# Patient Record
Sex: Male | Born: 1945 | Race: White | Hispanic: No | Marital: Single | Smoking: Current every day smoker
Health system: Southern US, Community
[De-identification: ages and names within clinical notes are randomized; demographics above are authoritative.]

## PROBLEM LIST (undated history)

## (undated) DIAGNOSIS — F32A Depression, unspecified: Secondary | ICD-10-CM

## (undated) DIAGNOSIS — I10 Essential (primary) hypertension: Secondary | ICD-10-CM

## (undated) DIAGNOSIS — E785 Hyperlipidemia, unspecified: Secondary | ICD-10-CM

## (undated) DIAGNOSIS — F329 Major depressive disorder, single episode, unspecified: Secondary | ICD-10-CM

## (undated) DIAGNOSIS — R413 Other amnesia: Secondary | ICD-10-CM

## (undated) DIAGNOSIS — K219 Gastro-esophageal reflux disease without esophagitis: Secondary | ICD-10-CM

## (undated) DIAGNOSIS — I213 ST elevation (STEMI) myocardial infarction of unspecified site: Secondary | ICD-10-CM

## (undated) HISTORY — DX: Major depressive disorder, single episode, unspecified: F32.9

## (undated) HISTORY — DX: Essential (primary) hypertension: I10

## (undated) HISTORY — DX: Gastro-esophageal reflux disease without esophagitis: K21.9

## (undated) HISTORY — DX: Depression, unspecified: F32.A

## (undated) HISTORY — DX: Hyperlipidemia, unspecified: E78.5

---
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
## 1993-02-04 HISTORY — PX: CHOLECYSTECTOMY: SHX55

## 1996-02-05 HISTORY — PX: HEMORROIDECTOMY: SUR656

## 2001-02-12 ENCOUNTER — Ambulatory Visit (HOSPITAL_COMMUNITY): Admission: RE | Admit: 2001-02-12 | Discharge: 2001-02-12 | Payer: Self-pay | Admitting: Internal Medicine

## 2001-02-12 ENCOUNTER — Encounter (HOSPITAL_BASED_OUTPATIENT_CLINIC_OR_DEPARTMENT_OTHER): Payer: Self-pay | Admitting: Internal Medicine

## 2001-04-02 ENCOUNTER — Encounter: Payer: Self-pay | Admitting: Emergency Medicine

## 2001-04-02 ENCOUNTER — Emergency Department (HOSPITAL_COMMUNITY): Admission: EM | Admit: 2001-04-02 | Discharge: 2001-04-02 | Payer: Self-pay | Admitting: Emergency Medicine

## 2001-04-17 ENCOUNTER — Ambulatory Visit (HOSPITAL_COMMUNITY): Admission: RE | Admit: 2001-04-17 | Discharge: 2001-04-17 | Payer: Self-pay | Admitting: Orthopedic Surgery

## 2001-04-17 ENCOUNTER — Encounter: Payer: Self-pay | Admitting: Orthopedic Surgery

## 2001-09-29 ENCOUNTER — Ambulatory Visit (HOSPITAL_BASED_OUTPATIENT_CLINIC_OR_DEPARTMENT_OTHER): Admission: RE | Admit: 2001-09-29 | Discharge: 2001-09-29 | Payer: Self-pay | Admitting: General Surgery

## 2003-04-21 ENCOUNTER — Emergency Department (HOSPITAL_COMMUNITY): Admission: EM | Admit: 2003-04-21 | Discharge: 2003-04-21 | Payer: Self-pay

## 2008-02-05 HISTORY — PX: SHOULDER ARTHROSCOPY W/ ROTATOR CUFF REPAIR: SHX2400

## 2011-05-06 ENCOUNTER — Encounter: Payer: Self-pay | Admitting: Gastroenterology

## 2011-06-03 ENCOUNTER — Ambulatory Visit (AMBULATORY_SURGERY_CENTER): Payer: Medicare Other | Admitting: *Deleted

## 2011-06-03 VITALS — Ht 69.5 in | Wt 222.0 lb

## 2011-06-03 DIAGNOSIS — Z1211 Encounter for screening for malignant neoplasm of colon: Secondary | ICD-10-CM

## 2011-06-03 MED ORDER — PEG-KCL-NACL-NASULF-NA ASC-C 100 G PO SOLR: ORAL | Status: DC

## 2011-06-04 ENCOUNTER — Encounter: Payer: Self-pay | Admitting: Gastroenterology

## 2011-06-13 ENCOUNTER — Telehealth: Payer: Self-pay | Admitting: *Deleted

## 2011-06-13 ENCOUNTER — Telehealth: Payer: Self-pay | Admitting: Gastroenterology

## 2011-06-13 NOTE — Telephone Encounter (Signed)
Pt states his pharmacist told him, "there are other preps that are cheaper, like the Miralax prep."  Writer explained that Dr. Jarold Motto doesn't use the Miralax prep at all and pt states he can't afford this.  Writer will leave a Moviprep voucher at the front desk with his name on it and he will pick up tomorrow

## 2011-06-17 ENCOUNTER — Ambulatory Visit (AMBULATORY_SURGERY_CENTER): Payer: Medicare Other | Admitting: Gastroenterology

## 2011-06-17 ENCOUNTER — Encounter: Payer: Self-pay | Admitting: Gastroenterology

## 2011-06-17 VITALS — BP 159/76 | HR 78 | Temp 96.6°F | Resp 18 | Ht 69.0 in | Wt 222.0 lb

## 2011-06-17 DIAGNOSIS — D126 Benign neoplasm of colon, unspecified: Secondary | ICD-10-CM

## 2011-06-17 DIAGNOSIS — Z1211 Encounter for screening for malignant neoplasm of colon: Secondary | ICD-10-CM

## 2011-06-17 MED ORDER — SODIUM CHLORIDE 0.9 % IV SOLN: 500.0000 mL | INTRAVENOUS | Status: DC

## 2011-06-17 NOTE — Progress Notes (Signed)
Patient did not experience any of the following events: a burn prior to discharge; a fall within the facility; wrong site/side/patient/procedure/implant event; or a hospital transfer or hospital admission upon discharge from the facility. (G8907) Patient did not have preoperative order for IV antibiotic SSI prophylaxis. (G8918)  

## 2011-06-17 NOTE — Op Note (Signed)
[facility] [address] [place], [place]  [address]  COLONOSCOPY PROCEDURE REPORT  PATIENT:  Austin Mora, Austin Mora  MR#:  [id number] BIRTHDATE:  1945-03-12, 65 yrs. old  GENDER:  male ENDOSCOPIST:  Vania Rea. Jarold Motto, MD, [facility] REF. BY:  Jarome Matin, M.D. PROCEDURE DATE:  06/17/2011 PROCEDURE:  Colonoscopy with snare polypectomy ASA CLASS:  Class II INDICATIONS:  Routine Risk Screening MEDICATIONS:   propofol (Diprivan) 160 mg IV  DESCRIPTION OF PROCEDURE:   After the risks and benefits and of the procedure were explained, informed consent was obtained. Digital rectal exam was performed and revealed no abnormalities. The LB [id number] endoscope was introduced through the anus and advanced to the cecum, which was identified by both the appendix and ileocecal valve.  The quality of the prep was excellent, using MoviPrep.  The instrument was then slowly withdrawn as the colon was fully examined. <<PROCEDUREIMAGES>>  FINDINGS:  A diminutive polyp was found in the mid transverse colon. 3 MM POLYP COLD SNARE EXCISED.SEE PICTURE.  This was otherwise a normal examination of the colon.   Retroflexed views in the rectum revealed no abnormalities.    The scope was then withdrawn from the patient and the procedure completed.  COMPLICATIONS:  None ENDOSCOPIC IMPRESSION: 1) Diminutive polyp in the mid transverse colon 2) Otherwise normal examination r/o adenoma RECOMMENDATIONS: 1) Repeat colonoscopy in 5 years if polyp adenomatous; otherwise 10 years  REPEAT EXAM:  No  ______________________________ Vania Rea. Jarold Motto, MD, [facility]  CC:  n. eSIGNED:   Vania Rea. Kenyatta Gloeckner at 06/17/2011 09:16 AM  Felizardo Hoffmann, [phone]

## 2011-06-17 NOTE — Patient Instructions (Signed)
YOU HAD AN ENDOSCOPIC PROCEDURE TODAY AT THE [facility]: Refer to the procedure report that was given to you for any specific questions about what was found during the examination.  If the procedure report does not answer your questions, please call your gastroenterologist to clarify.  If you requested that your care partner not be given the details of your procedure findings, then the procedure report has been included in a sealed envelope for you to review at your convenience later.  YOU SHOULD EXPECT: Some feelings of bloating in the abdomen. Passage of more gas than usual.  Walking can help get rid of the air that was put into your GI tract during the procedure and reduce the bloating. If you had a lower endoscopy (such as a colonoscopy or flexible sigmoidoscopy) you may notice spotting of blood in your stool or on the toilet paper. If you underwent a bowel prep for your procedure, then you may not have a normal bowel movement for a few days.  DIET: Your first meal following the procedure should be a light meal and then it is ok to progress to your normal diet.  A half-sandwich or bowl of soup is an example of a good first meal.  Heavy or fried foods are harder to digest and may make you feel nauseous or bloated.  Likewise meals heavy in dairy and vegetables can cause extra gas to form and this can also increase the bloating.  Drink plenty of fluids but you should avoid alcoholic beverages for 24 hours.  ACTIVITY: Your care partner should take you home directly after the procedure.  You should plan to take it easy, moving slowly for the rest of the day.  You can resume normal activity the day after the procedure however you should NOT DRIVE or use heavy machinery for 24 hours (because of the sedation medicines used during the test).    SYMPTOMS TO REPORT IMMEDIATELY: A gastroenterologist can be reached at any hour.  During normal business hours, 8:30 AM to 5:00 PM Monday through Friday,  call [phone].  After hours and on weekends, please call the GI answering service at [phone] who will take a message and have the physician on call contact you.   Following lower endoscopy (colonoscopy or flexible sigmoidoscopy):  Excessive amounts of blood in the stool  Significant tenderness or worsening of abdominal pains  Swelling of the abdomen that is new, acute  Fever of 100F or higher  Following upper endoscopy (EGD)  Vomiting of blood or coffee ground material  New chest pain or pain under the shoulder blades  Painful or persistently difficult swallowing  New shortness of breath  Fever of 100F or higher  Black, tarry-looking stools  FOLLOW UP: If any biopsies were taken you will be contacted by phone or by letter within the next 1-3 weeks.  Call your gastroenterologist if you have not heard about the biopsies in 3 weeks.  Our staff will call the home number listed on your records the next business day following your procedure to check on you and address any questions or concerns that you may have at that time regarding the information given to you following your procedure. This is a courtesy call and so if there is no answer at the home number and we have not heard from you through the emergency physician on call, we will assume that you have returned to your regular daily activities without incident.  SIGNATURES/CONFIDENTIALITY: You and/or your care   partner have signed paperwork which will be entered into your electronic medical record.  These signatures attest to the fact that that the information above on your After Visit Summary has been reviewed and is understood.  Full responsibility of the confidentiality of this discharge information lies with you and/or your care-partner.   HANDOUT ON POLYPS 

## 2011-06-18 ENCOUNTER — Telehealth: Payer: Self-pay | Admitting: *Deleted

## 2011-06-18 NOTE — Telephone Encounter (Signed)
previst nurse called to follow up with call.

## 2011-06-18 NOTE — Telephone Encounter (Signed)
  Follow up Call-  Call back number 06/17/2011  Post procedure Call Back phone  # [phone]  Permission to leave phone message Yes     No answer and answering machine did not pick up

## 2011-06-24 ENCOUNTER — Encounter: Payer: Self-pay | Admitting: Gastroenterology

## 2013-09-16 ENCOUNTER — Encounter: Payer: Self-pay | Admitting: Gastroenterology

## 2014-06-27 ENCOUNTER — Encounter: Payer: Self-pay | Admitting: Gastroenterology

## 2014-09-29 ENCOUNTER — Other Ambulatory Visit: Payer: Self-pay | Admitting: Internal Medicine

## 2014-09-29 DIAGNOSIS — Z720 Tobacco use: Secondary | ICD-10-CM

## 2016-05-30 DIAGNOSIS — M1711 Unilateral primary osteoarthritis, right knee: Secondary | ICD-10-CM | POA: Diagnosis not present

## 2016-05-30 DIAGNOSIS — S83241A Other tear of medial meniscus, current injury, right knee, initial encounter: Secondary | ICD-10-CM | POA: Diagnosis not present

## 2016-08-13 DIAGNOSIS — Z1389 Encounter for screening for other disorder: Secondary | ICD-10-CM | POA: Diagnosis not present

## 2016-08-13 DIAGNOSIS — Z720 Tobacco use: Secondary | ICD-10-CM | POA: Diagnosis not present

## 2016-08-13 DIAGNOSIS — R05 Cough: Secondary | ICD-10-CM | POA: Diagnosis not present

## 2016-08-13 DIAGNOSIS — E1129 Type 2 diabetes mellitus with other diabetic kidney complication: Secondary | ICD-10-CM | POA: Diagnosis not present

## 2016-08-13 DIAGNOSIS — R413 Other amnesia: Secondary | ICD-10-CM | POA: Diagnosis not present

## 2016-08-13 DIAGNOSIS — Z6828 Body mass index (BMI) 28.0-28.9, adult: Secondary | ICD-10-CM | POA: Diagnosis not present

## 2016-11-14 DIAGNOSIS — Z720 Tobacco use: Secondary | ICD-10-CM | POA: Diagnosis not present

## 2016-11-14 DIAGNOSIS — I10 Essential (primary) hypertension: Secondary | ICD-10-CM | POA: Diagnosis not present

## 2016-11-14 DIAGNOSIS — K219 Gastro-esophageal reflux disease without esophagitis: Secondary | ICD-10-CM | POA: Diagnosis not present

## 2016-11-14 DIAGNOSIS — Z6828 Body mass index (BMI) 28.0-28.9, adult: Secondary | ICD-10-CM | POA: Diagnosis not present

## 2016-11-14 DIAGNOSIS — E7849 Other hyperlipidemia: Secondary | ICD-10-CM | POA: Diagnosis not present

## 2016-11-14 DIAGNOSIS — N183 Chronic kidney disease, stage 3 (moderate): Secondary | ICD-10-CM | POA: Diagnosis not present

## 2016-11-14 DIAGNOSIS — Z0000 Encounter for general adult medical examination without abnormal findings: Secondary | ICD-10-CM | POA: Diagnosis not present

## 2016-11-14 DIAGNOSIS — Z125 Encounter for screening for malignant neoplasm of prostate: Secondary | ICD-10-CM | POA: Diagnosis not present

## 2016-11-14 DIAGNOSIS — E1129 Type 2 diabetes mellitus with other diabetic kidney complication: Secondary | ICD-10-CM | POA: Diagnosis not present

## 2016-11-14 DIAGNOSIS — R05 Cough: Secondary | ICD-10-CM | POA: Diagnosis not present

## 2016-11-21 DIAGNOSIS — R413 Other amnesia: Secondary | ICD-10-CM | POA: Diagnosis not present

## 2016-11-21 DIAGNOSIS — E7849 Other hyperlipidemia: Secondary | ICD-10-CM | POA: Diagnosis not present

## 2016-11-21 DIAGNOSIS — I10 Essential (primary) hypertension: Secondary | ICD-10-CM | POA: Diagnosis not present

## 2016-11-21 DIAGNOSIS — N183 Chronic kidney disease, stage 3 (moderate): Secondary | ICD-10-CM | POA: Diagnosis not present

## 2016-11-21 DIAGNOSIS — D751 Secondary polycythemia: Secondary | ICD-10-CM | POA: Diagnosis not present

## 2016-11-21 DIAGNOSIS — R05 Cough: Secondary | ICD-10-CM | POA: Diagnosis not present

## 2016-11-21 DIAGNOSIS — Z0000 Encounter for general adult medical examination without abnormal findings: Secondary | ICD-10-CM | POA: Diagnosis not present

## 2016-11-21 DIAGNOSIS — Z23 Encounter for immunization: Secondary | ICD-10-CM | POA: Diagnosis not present

## 2016-11-21 DIAGNOSIS — E1129 Type 2 diabetes mellitus with other diabetic kidney complication: Secondary | ICD-10-CM | POA: Diagnosis not present

## 2016-11-21 DIAGNOSIS — F5104 Psychophysiologic insomnia: Secondary | ICD-10-CM | POA: Diagnosis not present

## 2016-11-25 DIAGNOSIS — Z1212 Encounter for screening for malignant neoplasm of rectum: Secondary | ICD-10-CM | POA: Diagnosis not present

## 2017-05-29 DIAGNOSIS — R05 Cough: Secondary | ICD-10-CM | POA: Diagnosis not present

## 2017-05-29 DIAGNOSIS — R413 Other amnesia: Secondary | ICD-10-CM | POA: Diagnosis not present

## 2017-05-29 DIAGNOSIS — Z1389 Encounter for screening for other disorder: Secondary | ICD-10-CM | POA: Diagnosis not present

## 2017-05-29 DIAGNOSIS — F5104 Psychophysiologic insomnia: Secondary | ICD-10-CM | POA: Diagnosis not present

## 2017-05-29 DIAGNOSIS — Z6828 Body mass index (BMI) 28.0-28.9, adult: Secondary | ICD-10-CM | POA: Diagnosis not present

## 2017-05-29 DIAGNOSIS — E1129 Type 2 diabetes mellitus with other diabetic kidney complication: Secondary | ICD-10-CM | POA: Diagnosis not present

## 2017-05-30 ENCOUNTER — Other Ambulatory Visit: Payer: Self-pay | Admitting: Internal Medicine

## 2017-05-30 DIAGNOSIS — Z720 Tobacco use: Secondary | ICD-10-CM

## 2017-05-30 DIAGNOSIS — Z0000 Encounter for general adult medical examination without abnormal findings: Secondary | ICD-10-CM

## 2017-06-03 DIAGNOSIS — J449 Chronic obstructive pulmonary disease, unspecified: Secondary | ICD-10-CM | POA: Diagnosis not present

## 2017-06-04 DIAGNOSIS — J449 Chronic obstructive pulmonary disease, unspecified: Secondary | ICD-10-CM | POA: Diagnosis not present

## 2017-06-06 ENCOUNTER — Ambulatory Visit
Admission: RE | Admit: 2017-06-06 | Discharge: 2017-06-06 | Disposition: A | Discharge status: Home / Self Care | Payer: Medicare PPO | Source: Ambulatory Visit | Attending: Internal Medicine | Admitting: Internal Medicine

## 2017-06-06 DIAGNOSIS — F17210 Nicotine dependence, cigarettes, uncomplicated: Secondary | ICD-10-CM | POA: Diagnosis not present

## 2017-06-06 DIAGNOSIS — Z0000 Encounter for general adult medical examination without abnormal findings: Secondary | ICD-10-CM

## 2017-06-06 DIAGNOSIS — Z720 Tobacco use: Secondary | ICD-10-CM

## 2017-06-11 DIAGNOSIS — I2510 Atherosclerotic heart disease of native coronary artery without angina pectoris: Secondary | ICD-10-CM | POA: Diagnosis not present

## 2017-06-11 DIAGNOSIS — Z720 Tobacco use: Secondary | ICD-10-CM | POA: Diagnosis not present

## 2017-06-11 DIAGNOSIS — I10 Essential (primary) hypertension: Secondary | ICD-10-CM | POA: Diagnosis not present

## 2017-06-11 DIAGNOSIS — E119 Type 2 diabetes mellitus without complications: Secondary | ICD-10-CM | POA: Diagnosis not present

## 2017-06-16 DIAGNOSIS — I2510 Atherosclerotic heart disease of native coronary artery without angina pectoris: Secondary | ICD-10-CM | POA: Diagnosis not present

## 2017-06-23 DIAGNOSIS — I2510 Atherosclerotic heart disease of native coronary artery without angina pectoris: Secondary | ICD-10-CM | POA: Diagnosis not present

## 2017-06-23 DIAGNOSIS — E119 Type 2 diabetes mellitus without complications: Secondary | ICD-10-CM | POA: Diagnosis not present

## 2017-06-23 DIAGNOSIS — Z720 Tobacco use: Secondary | ICD-10-CM | POA: Diagnosis not present

## 2017-06-23 DIAGNOSIS — I10 Essential (primary) hypertension: Secondary | ICD-10-CM | POA: Diagnosis not present

## 2017-07-15 DIAGNOSIS — I951 Orthostatic hypotension: Secondary | ICD-10-CM | POA: Diagnosis not present

## 2017-07-21 DIAGNOSIS — I2510 Atherosclerotic heart disease of native coronary artery without angina pectoris: Secondary | ICD-10-CM | POA: Diagnosis not present

## 2017-07-21 DIAGNOSIS — I951 Orthostatic hypotension: Secondary | ICD-10-CM | POA: Diagnosis not present

## 2017-07-21 DIAGNOSIS — I10 Essential (primary) hypertension: Secondary | ICD-10-CM | POA: Diagnosis not present

## 2017-07-21 DIAGNOSIS — E119 Type 2 diabetes mellitus without complications: Secondary | ICD-10-CM | POA: Diagnosis not present

## 2017-11-19 ENCOUNTER — Ambulatory Visit: Payer: Medicare PPO | Admitting: Neurology

## 2017-11-19 ENCOUNTER — Encounter: Payer: Self-pay | Admitting: Neurology

## 2017-11-19 ENCOUNTER — Telehealth: Payer: Self-pay | Admitting: Neurology

## 2017-11-19 ENCOUNTER — Other Ambulatory Visit: Payer: Self-pay

## 2017-11-19 VITALS — BP 121/60 | HR 63 | Ht 69.0 in | Wt 191.5 lb

## 2017-11-19 DIAGNOSIS — G479 Sleep disorder, unspecified: Secondary | ICD-10-CM

## 2017-11-19 DIAGNOSIS — R413 Other amnesia: Secondary | ICD-10-CM

## 2017-11-19 NOTE — Progress Notes (Signed)
Reason for visit: Memory disorder  Referring physician: Dr. Smitty Pluck is a 72 y.o. male  History of present illness:  Austin Mora is a 72 year old left-handed white male with a history of a mild memory disturbance dating back about 2 years.  The patient has not given up any activities of daily living because of memory, but he does have some problems with remembering names for people.  The patient is having some short-term memory issues as well.  He still operates a motor vehicle without difficulty, he has not had any problems with directions.  He is able to keep up with his medications and appointments without problems.  His wife does the finances, and she always has.  The patient reports that he has a lot of problems with fatigue that has been chronic in nature but is gradually worsening over time.  The patient wakes up 2 or 3 times at night, he does snore at night.  The patient may fall asleep during the day when he is inactive.  The patient no longer feels like going out and doing things or socializing.  He does not want to travel.  He is on Crestor, but this was just recently started.  The patient has diabetes, he smokes cigarettes.  He denies any numbness or weakness of the extremities, he denies any significant balance issues.  He denies a family history of memory problems.  He has had blood work done recently that included a vitamin B12 level, I do not have the results of the study.  He comes to this office for an evaluation.  Past Medical History:  Diagnosis Date  . Depression   . Diabetes mellitus    type 2  . GERD (gastroesophageal reflux disease)   . Hyperlipidemia   . Hypertension     Past Surgical History:  Procedure Laterality Date  . APPENDECTOMY  1960  . CHOLECYSTECTOMY  1995  . HEMORROIDECTOMY  1998  . SHOULDER ARTHROSCOPY W/ ROTATOR CUFF REPAIR  2010   left    Family History  Problem Relation Age of Onset  . Heart attack Father   . Heart attack  Brother     Social history:  reports that he has been smoking. He has been smoking about 0.50 packs per day. He has never used smokeless tobacco. He reports that he does not drink alcohol or use drugs.  Medications:  Prior to Admission medications   Medication Sig Start Date End Date Taking? Authorizing Provider  metFORMIN (GLUCOPHAGE-XR) 500 MG 24 hr tablet Take 1 tablet by mouth daily. 04/24/11  Yes [provider]  metoprolol tartrate (LOPRESSOR) 25 MG tablet Take 1 tablet by mouth 2 (two) times daily. 04/24/11  Yes [provider]  omeprazole (PRILOSEC) 20 MG capsule Take 1 capsule by mouth daily. 04/30/11  Yes [provider]  rosuvastatin (CRESTOR) 20 MG tablet Take 20 mg by mouth daily. Every evening after dinner   Yes [provider]  Tamsulosin HCl (FLOMAX) 0.4 MG CAPS Take 1 capsule by mouth at bedtime. 05/07/11  Yes [provider]      Allergies  Allergen Reactions  . Sulfa Antibiotics Hives    ROS:  Out of a complete 14 system review of symptoms, the patient complains only of the following symptoms, and all other reviewed systems are negative.  Memory loss Diarrhea Decreased energy  Blood pressure 121/60, pulse 63, height 5\' 9"  (1.753 m), weight 191 lb 8 oz (86.9 kg), SpO2  98 %.  Physical Exam  General: The patient is alert and cooperative at the time of the examination.  Eyes: Pupils are equal, round, and reactive to light. Discs are flat bilaterally.  Neck: The neck is supple, no carotid bruits are noted.  Respiratory: The respiratory examination is clear.  Cardiovascular: The cardiovascular examination reveals a regular rate and rhythm, no obvious murmurs or rubs are noted.  Skin: Extremities are without significant edema.  Neurologic Exam  Mental status: The patient is alert and oriented x 3 at the time of the examination. The patient has apparent normal recent and remote memory, with an apparently normal  attention span and concentration ability.  Mini-Mental status examination done today shows a total score of 28/30.  Cranial nerves: Facial symmetry is present. There is good sensation of the face to pinprick and soft touch bilaterally. The strength of the facial muscles and the muscles to head turning and shoulder shrug are normal bilaterally. Speech is well enunciated, no aphasia or dysarthria is noted. Extraocular movements are full. Visual fields are full. The tongue is midline, and the patient has symmetric elevation of the soft palate. No obvious hearing deficits are noted.  Motor: The motor testing reveals 5 over 5 strength of all 4 extremities. Good symmetric motor tone is noted throughout.  Sensory: Sensory testing is intact to pinprick, soft touch, vibration sensation, and position sense on all 4 extremities. No evidence of extinction is noted.  Coordination: Cerebellar testing reveals good finger-nose-finger and heel-to-shin bilaterally.  Gait and station: Gait is normal. Tandem gait is normal. Romberg is negative. No drift is seen.  Reflexes: Deep tendon reflexes are symmetric and normal bilaterally. Toes are downgoing bilaterally.   Assessment/Plan:  1.  Mild cognitive impairment  2.  Chronic fatigue, daytime drowsiness  The patient does have risk factors for cerebrovascular disease, he is on low-dose aspirin.  The patient will be sent for MRI of the brain.  Given the fatigue issues and frequent nighttime awakenings, history of snoring, the patient will be sent for sleep evaluation.  The patient will follow-up through this office in about 6 months.  Austin Alexanders MD 11/19/2017 8:49 AM  [facility] [address] [place], [place] [address]  Phone [phone] Fax [phone]

## 2017-11-19 NOTE — Telephone Encounter (Signed)
Mcarthur Rossetti Josem Kaufmann: [id number] (exp. 11/19/17 to 12/19/17) order sent to GI. They will reach out to the pt to schedule.

## 2017-11-19 NOTE — Telephone Encounter (Signed)
Patient is aware of this and if he hasn't heard anything in the next 2-3 business days to give them a call at [phone].

## 2017-12-02 DIAGNOSIS — N183 Chronic kidney disease, stage 3 (moderate): Secondary | ICD-10-CM | POA: Diagnosis not present

## 2017-12-02 DIAGNOSIS — Z125 Encounter for screening for malignant neoplasm of prostate: Secondary | ICD-10-CM | POA: Diagnosis not present

## 2017-12-02 DIAGNOSIS — E7849 Other hyperlipidemia: Secondary | ICD-10-CM | POA: Diagnosis not present

## 2017-12-02 DIAGNOSIS — E1129 Type 2 diabetes mellitus with other diabetic kidney complication: Secondary | ICD-10-CM | POA: Diagnosis not present

## 2017-12-02 DIAGNOSIS — R82998 Other abnormal findings in urine: Secondary | ICD-10-CM | POA: Diagnosis not present

## 2017-12-02 DIAGNOSIS — Z23 Encounter for immunization: Secondary | ICD-10-CM | POA: Diagnosis not present

## 2017-12-09 DIAGNOSIS — E1129 Type 2 diabetes mellitus with other diabetic kidney complication: Secondary | ICD-10-CM | POA: Diagnosis not present

## 2017-12-09 DIAGNOSIS — K219 Gastro-esophageal reflux disease without esophagitis: Secondary | ICD-10-CM | POA: Diagnosis not present

## 2017-12-09 DIAGNOSIS — N183 Chronic kidney disease, stage 3 (moderate): Secondary | ICD-10-CM | POA: Diagnosis not present

## 2017-12-09 DIAGNOSIS — I2510 Atherosclerotic heart disease of native coronary artery without angina pectoris: Secondary | ICD-10-CM | POA: Diagnosis not present

## 2017-12-09 DIAGNOSIS — M5416 Radiculopathy, lumbar region: Secondary | ICD-10-CM | POA: Diagnosis not present

## 2017-12-09 DIAGNOSIS — Z0000 Encounter for general adult medical examination without abnormal findings: Secondary | ICD-10-CM | POA: Diagnosis not present

## 2017-12-09 DIAGNOSIS — F5104 Psychophysiologic insomnia: Secondary | ICD-10-CM | POA: Diagnosis not present

## 2017-12-09 DIAGNOSIS — D751 Secondary polycythemia: Secondary | ICD-10-CM | POA: Diagnosis not present

## 2017-12-09 DIAGNOSIS — R413 Other amnesia: Secondary | ICD-10-CM | POA: Diagnosis not present

## 2017-12-19 DIAGNOSIS — Z1212 Encounter for screening for malignant neoplasm of rectum: Secondary | ICD-10-CM | POA: Diagnosis not present

## 2018-01-19 ENCOUNTER — Ambulatory Visit
Admission: RE | Admit: 2018-01-19 | Discharge: 2018-01-19 | Disposition: A | Discharge status: Home / Self Care | Payer: Medicare PPO | Source: Ambulatory Visit | Attending: Neurology | Admitting: Neurology

## 2018-01-19 DIAGNOSIS — R413 Other amnesia: Secondary | ICD-10-CM

## 2018-01-20 ENCOUNTER — Telehealth: Payer: Self-pay | Admitting: Neurology

## 2018-01-20 NOTE — Telephone Encounter (Signed)
  I called the patient.  MRI of the brain shows mild to moderate changes of small vessel disease, the patient does have diabetes and hypertension, he is on low-dose aspirin.  Blood pressures in the office on last evaluation were adequate.  We will follow the memory issues over time.  MRI brain 01/19/18:  IMPRESSION: This MRI of the brain without contrast shows the following: 1.    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 2.    Brain volume is normal for age.

## 2018-01-20 NOTE — Telephone Encounter (Signed)
I contacted the patient's daughter (Amy, listed on DPR) and reviewed MRI results. She voiced understanding and requested that I call the patient and review and schedule f/u. I contacted the patient and reviewed MRI results. Patient verbalized understanding. F/u with Dr. Jannifer Franklin (patient did not want see NPs.) scheduled for 06/18/2017 at 12 pm. MB RN.

## 2018-01-20 NOTE — Telephone Encounter (Signed)
Pt daughter(on DPR-Amy O'Dell) is asking for a call with the results so she too can explain to pt if he is not clear on what RN states to him.  Daughter asking for a call after 3pm

## 2018-01-20 NOTE — Telephone Encounter (Signed)
Pt has called in response to message he received from Dr Jannifer Franklin.  Pt is asking for a call from RN to discuss the content of the message

## 2018-02-09 DIAGNOSIS — R311 Benign essential microscopic hematuria: Secondary | ICD-10-CM | POA: Diagnosis not present

## 2018-02-10 DIAGNOSIS — R413 Other amnesia: Secondary | ICD-10-CM | POA: Diagnosis not present

## 2018-02-10 DIAGNOSIS — F5104 Psychophysiologic insomnia: Secondary | ICD-10-CM | POA: Diagnosis not present

## 2018-02-10 DIAGNOSIS — Z6827 Body mass index (BMI) 27.0-27.9, adult: Secondary | ICD-10-CM | POA: Diagnosis not present

## 2018-02-10 DIAGNOSIS — F3289 Other specified depressive episodes: Secondary | ICD-10-CM | POA: Diagnosis not present

## 2018-03-09 ENCOUNTER — Telehealth: Payer: Self-pay | Admitting: Neurology

## 2018-03-09 NOTE — Telephone Encounter (Signed)
I contacted the pt and left a vm requesting a call back to move up appt with Dr. Jannifer Franklin to discuss memory changes if agreeable.

## 2018-03-09 NOTE — Telephone Encounter (Signed)
Pts daughter Amy (on [place]) called stating the pts forgetfulness is getting worse. Daughter would like to know if he could be reevaluated again and seen sooner than his May appt. Please advise.

## 2018-03-09 NOTE — Telephone Encounter (Signed)
I contacted Amy and left a vm requesting a call back to discuss a earlier appt.

## 2018-04-14 DIAGNOSIS — F3289 Other specified depressive episodes: Secondary | ICD-10-CM | POA: Diagnosis not present

## 2018-04-14 DIAGNOSIS — Z6832 Body mass index (BMI) 32.0-32.9, adult: Secondary | ICD-10-CM | POA: Diagnosis not present

## 2018-04-14 DIAGNOSIS — R413 Other amnesia: Secondary | ICD-10-CM | POA: Diagnosis not present

## 2018-04-14 DIAGNOSIS — F5104 Psychophysiologic insomnia: Secondary | ICD-10-CM | POA: Diagnosis not present

## 2018-04-14 DIAGNOSIS — I2510 Atherosclerotic heart disease of native coronary artery without angina pectoris: Secondary | ICD-10-CM | POA: Diagnosis not present

## 2018-04-14 DIAGNOSIS — E1129 Type 2 diabetes mellitus with other diabetic kidney complication: Secondary | ICD-10-CM | POA: Diagnosis not present

## 2018-06-09 DIAGNOSIS — E1129 Type 2 diabetes mellitus with other diabetic kidney complication: Secondary | ICD-10-CM | POA: Diagnosis not present

## 2018-06-09 DIAGNOSIS — F329 Major depressive disorder, single episode, unspecified: Secondary | ICD-10-CM | POA: Diagnosis not present

## 2018-06-09 DIAGNOSIS — F5104 Psychophysiologic insomnia: Secondary | ICD-10-CM | POA: Diagnosis not present

## 2018-06-09 DIAGNOSIS — I2510 Atherosclerotic heart disease of native coronary artery without angina pectoris: Secondary | ICD-10-CM | POA: Diagnosis not present

## 2018-06-15 ENCOUNTER — Telehealth: Payer: Self-pay | Admitting: Neurology

## 2018-06-15 NOTE — Telephone Encounter (Signed)
Amy Odell(daughter on DPR) has called, she is asking for a call from RN as well re: the appointment which is currently scheduled for 05-15.  Daughter's concern is that pt's wife will not be clearly understood when she tries explaining what is going on with pt.

## 2018-06-15 NOTE — Telephone Encounter (Signed)
Pt can not do a Virtual Visit Pt would like to be contacted back to explore a different option. Please advise.

## 2018-06-15 NOTE — Telephone Encounter (Signed)
I contacted the pt's daughter (Amy ok per dpr) she states pt is in need of a face to face visit to discuss behavioral changes.  I advised we could see the pt on 06/17/18 at 10:30 am. Daughter was agreeable to appt and will call her mom to be on speaker phone during the visit since she lives in [place].  I attempted to reach the pt's wife. I left her a vm to return my call. When wife call's back please advise we will see the pt on 06/17/18 at 10:30 am check in time of 10 am and ask prescreening questions.

## 2018-06-17 ENCOUNTER — Ambulatory Visit: Payer: Self-pay | Admitting: Neurology

## 2018-06-19 ENCOUNTER — Ambulatory Visit: Payer: Self-pay | Admitting: Neurology

## 2018-06-23 ENCOUNTER — Telehealth: Payer: Self-pay | Admitting: Neurology

## 2018-06-23 NOTE — Telephone Encounter (Signed)
Patient called and stated that her symptoms are getting worse and she would like to speak with the nurse regarding this. She feels she may need to be seen soon. Please call and advise.

## 2018-06-23 NOTE — Telephone Encounter (Signed)
I returned the pt's wife [place] Mora, ok per [facility] ) call. She was requesting pt's appt from 06/17/18 be rescheduled due to memory changes. I advised we could see the pt on 07/01/18 at 11 am check in time of 10:30 and wife was agreeable.   Pt past the prescreening for covid 19.

## 2018-07-01 ENCOUNTER — Other Ambulatory Visit: Payer: Self-pay

## 2018-07-01 ENCOUNTER — Ambulatory Visit: Payer: Medicare PPO | Admitting: Neurology

## 2018-07-01 ENCOUNTER — Encounter: Payer: Self-pay | Admitting: Neurology

## 2018-07-01 VITALS — BP 127/68 | HR 68 | Temp 97.5°F | Ht 69.0 in | Wt 186.0 lb

## 2018-07-01 DIAGNOSIS — G479 Sleep disorder, unspecified: Secondary | ICD-10-CM

## 2018-07-01 DIAGNOSIS — R413 Other amnesia: Secondary | ICD-10-CM | POA: Diagnosis not present

## 2018-07-01 HISTORY — DX: Other amnesia: R41.3

## 2018-07-01 MED ORDER — DONEPEZIL HCL 5 MG PO TABS: 5.0000 mg | ORAL_TABLET | Freq: Every day | ORAL | 1 refills | Status: DC

## 2018-07-01 NOTE — Patient Instructions (Signed)
We will start aricept for the memory.  Begin Aricept (donepezil) at 5 mg at night for one month. If this medication is well-tolerated, please call our office and we will call in a prescription for the 10 mg tablets. Look out for side effects that may include nausea, diarrhea, weight loss, or stomach cramps. This medication will also cause a runny nose, therefore there is no need for allergy medications for this purpose.  

## 2018-07-01 NOTE — Progress Notes (Signed)
Reason for visit: Memory disturbance  Austin Mora is an 73 y.o. male  History of present illness:  Austin Mora is a 73 year old left-handed white male with a history of a decline in memory over the last 2-1/2 or 3 years.  The patient has had trouble with remembering names for people, she has short-term memory issues.  He now requires some assistance with managing medications and appointments, his wife has always done the finances.  The patient still operates a motor vehicle without difficulty.  He has had some decline in his functional levels, he will forget how to operate the remote to the TV, he will go into the room and cannot remember what he needed.  The patient needs very little during the day, potentially a bowl of cereal or a bowl of soup.  He sleeps for 12 to 14 hours a day, he is sleeping well at night, he talks in his sleep as well, he may have vivid dreams.  He stopped going to church 7 to 8 months ago.  The patient is on Abilify and Lexapro for depression.  He has had some alteration in his balance, he has had occasional falls, he may bump his head on occasion.  The patient did undergo MRI of the brain which showed a moderate level small vessel disease, he does take low-dose aspirin.  He has a history of hypertension and diabetes, he denies any history of hypoglycemia.  He returns for an evaluation.  He was set up for a sleep evaluation on his last visit but this apparently never happened.  Past Medical History:  Diagnosis Date  . Depression   . Diabetes mellitus    type 2  . GERD (gastroesophageal reflux disease)   . Hyperlipidemia   . Hypertension     Past Surgical History:  Procedure Laterality Date  . APPENDECTOMY  1960  . CHOLECYSTECTOMY  1995  . HEMORROIDECTOMY  1998  . SHOULDER ARTHROSCOPY W/ ROTATOR CUFF REPAIR  2010   left    Family History  Problem Relation Age of Onset  . Heart attack Father   . Heart attack Brother     Social history:  reports that he  has been smoking. He has been smoking about 0.50 packs per day. He has never used smokeless tobacco. He reports that he does not drink alcohol or use drugs.    Allergies  Allergen Reactions  . Sulfa Antibiotics Hives    Medications:  Prior to Admission medications   Medication Sig Start Date End Date Taking? Authorizing Provider  ARIPiprazole (ABILIFY) 5 MG tablet  05/05/18  Yes [provider]  aspirin 81 MG tablet Take 81 mg by mouth daily.   Yes [provider]  escitalopram (LEXAPRO) 10 MG tablet Take 10 mg by mouth daily. 05/08/18  Yes [provider]  omeprazole (PRILOSEC) 20 MG capsule Take 1 capsule by mouth daily. 04/30/11  Yes [provider]  rosuvastatin (CRESTOR) 20 MG tablet Take 20 mg by mouth daily. Every evening after dinner   Yes [provider]  metFORMIN (GLUCOPHAGE-XR) 500 MG 24 hr tablet Take 1 tablet by mouth daily. 04/24/11   [provider]  metoprolol tartrate (LOPRESSOR) 25 MG tablet Take 1 tablet by mouth 2 (two) times daily. 04/24/11   [provider]  Tamsulosin HCl (FLOMAX) 0.4 MG CAPS Take 1 capsule by mouth at bedtime. 05/07/11   [provider]    ROS:  Out of a complete 14 system  review of symptoms, the patient complains only of the following symptoms, and all other reviewed systems are negative.  Memory problems Fatigue, excessive daytime drowsiness Depression  Blood pressure 127/68, pulse 68, temperature (!) 97.5 F (36.4 C), temperature source Oral, height 5\' 9"  (1.753 m), weight 186 lb (84.4 kg).  Physical Exam  General: The patient is alert and cooperative at the time of the examination.  Skin: No significant peripheral edema is noted.   Neurologic Exam  Mental status: The patient is alert and oriented x 3 at the time of the examination. The Mini-Mental status examination done today shows a total score of 25/30.   Cranial nerves: Facial symmetry is present. Speech is  normal, no aphasia or dysarthria is noted. Extraocular movements are full. Visual fields are full.  The patient has some masking the face, flattening of affect.  Motor: The patient has good strength in all 4 extremities.  Sensory examination: Soft touch sensation is symmetric on the face, arms, and legs.  Coordination: The patient has good finger-nose-finger and heel-to-shin bilaterally.  Gait and station: The patient has a normal gait. Tandem gait is normal. Romberg is negative. No drift is seen.  The patient has good bilateral arm swing with walking.  Reflexes: Deep tendon reflexes are symmetric.   Assessment/Plan:  1.  Memory disturbance  2.  Excessive daytime drowsiness  The patient will be once again set up for sleep evaluation.  The patient will be placed on Aricept taking 5 mg at night, he will call after 1 month if he tolerates this and will go to the 10 mg dosing.  He will follow-up here in 6 months.  Jill Alexanders MD 07/01/2018 11:24 AM  [facility] [address] [place], [place] [address]  Phone [phone] Fax [phone]

## 2018-07-06 ENCOUNTER — Telehealth: Payer: Self-pay | Admitting: Neurology

## 2018-07-06 NOTE — Telephone Encounter (Signed)
Called the patient.  The patient is having vivid dreams on the Aricept, he will start taking the medication in the morning, if the vivid dreams continue we will have to stop the medication and use another drug.

## 2018-07-06 NOTE — Telephone Encounter (Signed)
Pt called stating that the donepezil (ARICEPT) 5 MG tablet is giving him bad dreams and its not working. He would like to know if he can try something else in it's place. Please advise.

## 2018-07-16 ENCOUNTER — Other Ambulatory Visit: Payer: Self-pay

## 2018-07-16 ENCOUNTER — Ambulatory Visit (INDEPENDENT_AMBULATORY_CARE_PROVIDER_SITE_OTHER): Payer: Medicare PPO | Admitting: Neurology

## 2018-07-16 ENCOUNTER — Encounter: Payer: Self-pay | Admitting: Neurology

## 2018-07-16 VITALS — BP 116/71 | HR 61 | Temp 97.5°F | Ht 69.0 in | Wt 171.0 lb

## 2018-07-16 DIAGNOSIS — R413 Other amnesia: Secondary | ICD-10-CM

## 2018-07-16 DIAGNOSIS — G4752 REM sleep behavior disorder: Secondary | ICD-10-CM | POA: Diagnosis not present

## 2018-07-16 DIAGNOSIS — G20 Parkinson's disease: Secondary | ICD-10-CM

## 2018-07-16 NOTE — Progress Notes (Signed)
SLEEP MEDICINE CLINIC    Provider:  Larey Seat, MD  Primary Care Physician:  Leanna Battles, [place] [address]     Referring Provider:  Dr Jannifer Franklin, MD          Chief Complaint according to patient   Patient presents with:     New Patient (Initial Visit)           HISTORY OF PRESENT ILLNESS:  Austin Mora is a 73 y.o. year old White or Caucasian male patient seen here as a referral on 07/16/2018 from  Dr Jannifer Franklin oin a face to face visit.  Chief concern according to patient : " I am fatigued but when I go to bed I often can't sleep"     I have the pleasure of seeing Austin Mora today, a left -handed White or Caucasian male with a possible sleep disorder.  She has a  has a past medical history of Depression, Diabetes mellitus, GERD (gastroesophageal reflux disease), Hyperlipidemia, Hypertension, and Memory disorder (07/01/2018)..  The patient lost 50 pounds, years ago and stopped walking after he reached his goal 2005. .        Family medical Rachelle Hora history: No other family member on CPAP with OSA, insomnia, sleep walkers.    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 He is here with his wife and  lives in a household with 2 persons. Family status is married with adult daughter- 2 grandchildren.  The patient currently works part time before corona virus crises.  No pets are  present. Tobacco use- current smoker, 10- 15 cig a day. ETOH use : none . Caffeine intake in form of Coffee( 1 in AM) Soda( during the day-  every day, Tea (soemtimes when eating out ) , no energy drinks. Regular exercise : none  , used to walk.   Hobbies: none     Sleep habits are as follows:  The patient's dinner time is between 6 PM.  The patient goes to bed at midnight PM and struggles to sleep-continues to sleep for 2 hours, wakes again - for 2-3  bathroom breaks, the first time at 2 AM.  He has  trouble to go back to sleep. He has slept often 1-3 hours  already in the recliner in front of the TV, feels that there is better sleep in that position.   The preferred sleep position in bed is supine or side, with the support of his 2 my pillows.  Dreams are reportedly frequent and very vivid. When he wakes up he is often disoriented as to being real or in a dream.  He acts out dreams, defending himself, kicking, yelling, boxing.   The couple quit sleeping in the same room 6 month prior to this appointment.  11-11.45 AM  is the usual rise time.  The patient wakes up spontaneously.  He reports not feeling refreshed or restored in AM, with symptoms such as dry mouth but not  morning headaches Naps are taken frequently, not planned- but he will fall asleep whenever he rests.  lasting from 15 - 50 minutes and are more refreshing than nocturnal sleep.     Review of Systems: Out of a complete 14 system review, the patient complains of only the following symptoms, and all other reviewed systems are negative.: Fatigue, sleepiness , snoring, fragmented sleep, Insomnia - hearing loss, masked face, hoarse voice.  How likely are you to doze in the following situations: 0 = not likely, 1 = slight chance, 2 = moderate chance, 3 = high chance   Sitting and Reading? 2 Watching Television? 2 Sitting inactive in a public place (theater or meeting)? As a passenger in a car for an hour without a break? Lying down in the afternoon when circumstances permit?2 Sitting and talking to someone? Sitting quietly after lunch without alcohol? 2 In a car, while stopped for a few minutes in traffic?   Total = 8/ 24 points   FSS endorsed at 40 / 63 points.   Social History   Socioeconomic History   Marital status: Single    Spouse name: Not on file   Number of children: Not on file   Years of education: 12   Highest education level: Not on file  Occupational History   Occupation: Part time -  Nature conservation officer [employer]  Social Needs   Financial resource strain: Not on file   Food insecurity    Worry: Not on file    Inability: Not on file   Transportation needs    Medical: Not on file    Non-medical: Not on file  Tobacco Use   Smoking status: Current Every Day Smoker    Packs/day: 0.50   Smokeless tobacco: Never Used  Substance and Sexual Activity   Alcohol use: No   Drug use: No   Sexual activity: Not on file  Lifestyle   Physical activity    Days per week: Not on file    Minutes per session: Not on file   Stress: Not on file  Relationships   Social connections    Talks on phone: Not on file    Gets together: Not on file    Attends religious service: Not on file    Active member of club or organization: Not on file    Attends meetings of clubs or organizations: Not on file    Relationship status: Not on file  Other Topics Concern   Not on file  Social History Narrative   Lives with wife   Caffeine use: 2 cups per day (coffee)   Left handed     Family History  Problem Relation Age of Onset   Heart attack Father    Heart attack Brother     Past Medical History:  Diagnosis Date   Depression    Diabetes mellitus    type 2   GERD (gastroesophageal reflux disease)    Hyperlipidemia    Hypertension    Memory disorder 07/01/2018    Past Surgical History:  Procedure Laterality Date   [place] ARTHROSCOPY W/ ROTATOR CUFF REPAIR  2010   left     Current Outpatient Medications on File Prior to Visit  Medication Sig Dispense Refill   ARIPiprazole (ABILIFY) 5 MG tablet      aspirin 81 MG tablet Take 81 mg by mouth daily.     donepezil (ARICEPT) 5 MG tablet Take 1 tablet (5 mg total) by mouth at bedtime. 30 tablet 1   escitalopram (LEXAPRO) 10 MG tablet Take 10 mg by mouth daily.     omeprazole (PRILOSEC) 40 MG capsule Take 1 capsule by mouth daily.       rosuvastatin (CRESTOR) 20 MG tablet Take 20 mg by mouth daily. Every evening after dinner     No current facility-administered medications on file prior to  visit.     Allergies  Allergen Reactions   Sulfa Antibiotics Hives    Physical exam:  Today's Vitals   07/16/18 1255  BP: 116/71  Pulse: 61  Temp: (!) 97.5 F (36.4 C)  Weight: 171 lb (77.6 kg)  Height: 5\' 9"  (1.753 m)   Body mass index is 25.25 kg/m.   Wt Readings from Last 3 Encounters:  07/16/18 171 lb (77.6 kg)  07/01/18 186 lb (84.4 kg)  11/19/17 191 lb 8 oz (86.9 kg)     Ht Readings from Last 3 Encounters:  07/16/18 5\' 9"  (1.753 m)  07/01/18 5\' 9"  (1.753 m)  11/19/17 5\' 9"  (1.753 m)      General: The patient is awake, alert and appears not in acute distress. The patient is well groomed. Head: Normocephalic, atraumatic. Neck is supple. Mallampati: 4 - no tongue tremor.   ,  neck circumference:17" inches . Nasal airflow  patent.  Retrognathia is not seen  Dental status:  Cardiovascular:  Regular rate and cardiac rhythm by pulse,  without distended neck veins. Respiratory: Lungs are clear to auscultation.  Skin:  Without evidence of ankle edema, or rash. Trunk: The patient's posture is stooped.   Neurologic exam : The patient is awake and alert,  oriented to place and time.   Memory subjective described as intact.  Attention span & concentration ability appears normal.  Speech is fluent, without  dysarthria, dysphonia or aphasia.  Mood and affect are appropriate.   Cranial nerves: no loss of smell or taste reported  Pupils are equal and briskly reactive to light.  Funduscopic exam :  Deferred.  Extraocular movements in vertical and horizontal planes were intact and without nystagmus.  No Diplopia. Visual fields by finger perimetry are intact. Hearing impaired.  Facial sensation intact to fine touch.  Facial motor strength is  Reduced, Mimik is reduced.  Symmetric and tongue and uvula move midline.    Neck ROM :  Decreased ability, stiffness  shoulder shrug was asymmetrical., left shoulder drooped- biceps on the left is atrophied.    Motor exam:  Symmetric bulk, tone and ROM.   There is a surprisingly low tone in both arms.   without cog wheeling, symmetric grip strength .   Coordination: Rapid alternating movements in the fingers/hands were of normal speed.  The Finger-to-nose maneuver was intact with evidence of ataxia, dysmetria or tremor.  the patient turned with  steps.  Toe and heel walk were deferred.  Deep tendon reflexes: deferred.        After spending a total time of  40  minutes face to face and additional time for physical and neurologic examination, review of laboratory studies,  personal review of imaging studies, reports and results of other testing and review of referral information / records as far as provided in visit, I have established the following assessments:  1) REM BD-   2) snoring   3) fragmented circadian rhythm    My Plan is to proceed with: REM BD PSG montage -   I would like to thank Leanna Battles, MD and Dr Jannifer Franklin, MD   for allowing me to meet with and to take care of this pleasant patient.   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 He reports memory problems , a symptom that can be attributed to parkinson's disease and associated  Dementia.  .   I plan to follow up either personally  or through our NP within 3 month.     Electronically signed by: Larey Seat, MD 07/16/2018 1:09 PM  [facility] Board certified by The [employer] of Sleep Medicine and Diplomate of the [employer] of Sleep Medicine. Board certified In Neurology through the [place], Fellow of the [employer] of Neurology. Medical Director of [facility].

## 2018-07-21 DIAGNOSIS — I10 Essential (primary) hypertension: Secondary | ICD-10-CM | POA: Diagnosis not present

## 2018-07-21 DIAGNOSIS — F5104 Psychophysiologic insomnia: Secondary | ICD-10-CM | POA: Diagnosis not present

## 2018-07-21 DIAGNOSIS — R413 Other amnesia: Secondary | ICD-10-CM | POA: Diagnosis not present

## 2018-07-21 DIAGNOSIS — F329 Major depressive disorder, single episode, unspecified: Secondary | ICD-10-CM | POA: Diagnosis not present

## 2018-07-23 ENCOUNTER — Telehealth: Payer: Self-pay | Admitting: Neurology

## 2018-07-23 NOTE — Telephone Encounter (Signed)
Pt has called to inform that he has been having dreams since taking the donepezil (ARICEPT) 5 MG tablet.  When asked what kind of dreams pt is having he said a good one then a bad one.  Please call.

## 2018-07-23 NOTE — Telephone Encounter (Signed)
I called the patient.  The donepezil still causing troubles with vivid dreams, we talked about switching it to the morning on 06 July 2018, the patient will stop the drug at this point, he will call me next week and we will consider switch over to [place].

## 2018-07-23 NOTE — Addendum Note (Signed)
Addended by: Kathrynn Ducking on: 07/23/2018 05:24 PM   Modules accepted: Orders

## 2018-07-23 NOTE — Telephone Encounter (Signed)
Pt's daughter Amy on [place] called as well and requested to be called as well about this situation. [phone]

## 2018-07-23 NOTE — Telephone Encounter (Signed)
Daughter returned call and we were able to discuss the medication. Advised the patient was started on the medication by Dr Jannifer Franklin. Informed that he is out of the office and I will discuss with Dr Brett Fairy and since Dr Brett Fairy has met the patient I will ask her thoughts. I advised I will also call and talk with the patient.   I called the patient and spoke with him to and informed him Dr Jannifer Franklin is out of the office. I informed him I saw where previously in early June they discussed starting a new medication if the dreams continued. Patient states the dreams are so vivid and bad that it wakes him up. I advised for now to stop taking the medication and I will contact him with what medication Dr Jannifer Franklin would like to start once he returns to the office on Monday. Advised the patient ill make Dr Brett Fairy aware as well. Pt verbalized understanding.

## 2018-07-23 NOTE — Telephone Encounter (Signed)
Called the pt's daughter to get more information on whether this is causing problems for the patient with continuing the medication. I saw where Dr Jannifer Franklin discussed this as a concern previously for the pt in early June and there was discussion about another medication at that time. LVM for the daughter to call back to get more information.

## 2018-07-23 NOTE — Telephone Encounter (Signed)
Best to take aricept in the morning when it induced vivid dreams- it is a common side effect of the medication. He responded that he takes it already in the morning- and only 5 mg.  I will ask Dr Jannifer Franklin to further discuss with the patient if he should try N amenda instead, for now I asked him to stop the aricept.   He asked Dr Jannifer Franklin to call him on Monday at [phone] that is his mobile phone.    CD

## 2018-07-28 ENCOUNTER — Encounter (HOSPITAL_COMMUNITY)
Admission: EM | Disposition: A | Discharge status: Skilled Nursing Facility | Payer: Self-pay | Source: Home / Self Care | Attending: Cardiovascular Disease

## 2018-07-28 ENCOUNTER — Other Ambulatory Visit: Payer: Self-pay

## 2018-07-28 ENCOUNTER — Ambulatory Visit (HOSPITAL_COMMUNITY): Admit: 2018-07-28 | Payer: Self-pay | Admitting: Cardiovascular Disease

## 2018-07-28 ENCOUNTER — Inpatient Hospital Stay (HOSPITAL_COMMUNITY)
Admission: EM | Admit: 2018-07-28 | Discharge: 2018-08-05 | DRG: 247 | Disposition: A | Discharge status: Skilled Nursing Facility | Payer: Medicare PPO | Attending: Cardiovascular Disease | Admitting: Cardiovascular Disease

## 2018-07-28 DIAGNOSIS — F17210 Nicotine dependence, cigarettes, uncomplicated: Secondary | ICD-10-CM | POA: Diagnosis not present

## 2018-07-28 DIAGNOSIS — Z955 Presence of coronary angioplasty implant and graft: Secondary | ICD-10-CM

## 2018-07-28 DIAGNOSIS — I2102 ST elevation (STEMI) myocardial infarction involving left anterior descending coronary artery: Secondary | ICD-10-CM | POA: Diagnosis not present

## 2018-07-28 DIAGNOSIS — G4752 REM sleep behavior disorder: Secondary | ICD-10-CM | POA: Diagnosis not present

## 2018-07-28 DIAGNOSIS — Z1159 Encounter for screening for other viral diseases: Secondary | ICD-10-CM | POA: Diagnosis not present

## 2018-07-28 DIAGNOSIS — E785 Hyperlipidemia, unspecified: Secondary | ICD-10-CM | POA: Diagnosis not present

## 2018-07-28 DIAGNOSIS — I255 Ischemic cardiomyopathy: Secondary | ICD-10-CM | POA: Diagnosis present

## 2018-07-28 DIAGNOSIS — I10 Essential (primary) hypertension: Secondary | ICD-10-CM | POA: Diagnosis not present

## 2018-07-28 DIAGNOSIS — R0902 Hypoxemia: Secondary | ICD-10-CM | POA: Diagnosis not present

## 2018-07-28 DIAGNOSIS — K219 Gastro-esophageal reflux disease without esophagitis: Secondary | ICD-10-CM | POA: Diagnosis present

## 2018-07-28 DIAGNOSIS — Z8249 Family history of ischemic heart disease and other diseases of the circulatory system: Secondary | ICD-10-CM

## 2018-07-28 DIAGNOSIS — R5381 Other malaise: Secondary | ICD-10-CM | POA: Diagnosis not present

## 2018-07-28 DIAGNOSIS — I2510 Atherosclerotic heart disease of native coronary artery without angina pectoris: Secondary | ICD-10-CM | POA: Diagnosis present

## 2018-07-28 DIAGNOSIS — I2109 ST elevation (STEMI) myocardial infarction involving other coronary artery of anterior wall: Secondary | ICD-10-CM | POA: Diagnosis not present

## 2018-07-28 DIAGNOSIS — Z7401 Bed confinement status: Secondary | ICD-10-CM | POA: Diagnosis not present

## 2018-07-28 DIAGNOSIS — E119 Type 2 diabetes mellitus without complications: Secondary | ICD-10-CM | POA: Diagnosis not present

## 2018-07-28 DIAGNOSIS — I213 ST elevation (STEMI) myocardial infarction of unspecified site: Secondary | ICD-10-CM

## 2018-07-28 DIAGNOSIS — R079 Chest pain, unspecified: Secondary | ICD-10-CM | POA: Diagnosis not present

## 2018-07-28 DIAGNOSIS — F329 Major depressive disorder, single episode, unspecified: Secondary | ICD-10-CM | POA: Diagnosis not present

## 2018-07-28 DIAGNOSIS — G20 Parkinson's disease: Secondary | ICD-10-CM | POA: Diagnosis not present

## 2018-07-28 DIAGNOSIS — R413 Other amnesia: Secondary | ICD-10-CM | POA: Diagnosis present

## 2018-07-28 DIAGNOSIS — R2681 Unsteadiness on feet: Secondary | ICD-10-CM | POA: Diagnosis not present

## 2018-07-28 DIAGNOSIS — R410 Disorientation, unspecified: Secondary | ICD-10-CM | POA: Diagnosis not present

## 2018-07-28 DIAGNOSIS — M2550 Pain in unspecified joint: Secondary | ICD-10-CM | POA: Diagnosis not present

## 2018-07-28 DIAGNOSIS — R069 Unspecified abnormalities of breathing: Secondary | ICD-10-CM | POA: Diagnosis not present

## 2018-07-28 HISTORY — PX: CORONARY/GRAFT ACUTE MI REVASCULARIZATION: CATH118305

## 2018-07-28 HISTORY — DX: ST elevation (STEMI) myocardial infarction of unspecified site: I21.3

## 2018-07-28 HISTORY — PX: LEFT HEART CATH AND CORONARY ANGIOGRAPHY: CATH118249

## 2018-07-28 HISTORY — PX: CORONARY STENT INTERVENTION: CATH118234

## 2018-07-28 LAB — COMPREHENSIVE METABOLIC PANEL
ALT: 26 U/L (ref 0–44)
AST: 32 U/L (ref 15–41)
Albumin: 4.3 g/dL (ref 3.5–5.0)
Alkaline Phosphatase: 65 U/L (ref 38–126)
Anion gap: 13 (ref 5–15)
BUN: 14 mg/dL (ref 8–23)
CO2: 22 mmol/L (ref 22–32)
Calcium: 9.4 mg/dL (ref 8.9–10.3)
Chloride: 104 mmol/L (ref 98–111)
Creatinine, Ser: 1.24 mg/dL (ref 0.61–1.24)
GFR calc Af Amer: >60 mL/min (ref >60)
GFR calc non Af Amer: 57 mL/min — ABNORMAL LOW (ref >60)
Glucose, Bld: 108 mg/dL — ABNORMAL HIGH (ref 70–99)
Potassium: 3.8 mmol/L (ref 3.5–5.1)
Sodium: 139 mmol/L (ref 135–145)
Total Bilirubin: 2 mg/dL — ABNORMAL HIGH (ref 0.3–1.2)
Total Protein: 6.7 g/dL (ref 6.5–8.1)

## 2018-07-28 LAB — CBC
HCT: 49.3 % (ref 39.0–52.0)
Hemoglobin: 16.5 g/dL (ref 13.0–17.0)
MCH: 29.4 pg (ref 26.0–34.0)
MCHC: 33.5 g/dL (ref 30.0–36.0)
MCV: 87.9 fL (ref 80.0–100.0)
Platelets: 131 10*3/uL — ABNORMAL LOW (ref 150–400)
RBC: 5.61 MIL/uL (ref 4.22–5.81)
RDW: 12.5 % (ref 11.5–15.5)
WBC: 7.4 10*3/uL (ref 4.0–10.5)
nRBC: 0 % (ref 0.0–0.2)

## 2018-07-28 LAB — LIPID PANEL
Cholesterol: 76 mg/dL (ref 0–200)
HDL: 28 mg/dL — ABNORMAL LOW (ref >40)
LDL Cholesterol: 29 mg/dL (ref 0–99)
Total CHOL/HDL Ratio: 2.7 RATIO
Triglycerides: 95 mg/dL (ref <150)
VLDL: 19 mg/dL (ref 0–40)

## 2018-07-28 LAB — PROTIME-INR
INR: 1.3 — ABNORMAL HIGH (ref 0.8–1.2)
Prothrombin Time: 15.8 seconds — ABNORMAL HIGH (ref 11.4–15.2)

## 2018-07-28 LAB — TROPONIN I (HIGH SENSITIVITY)
Troponin I (High Sensitivity): 513 ng/L (ref <18)
Troponin I (High Sensitivity): 860 ng/L (ref <18)
Troponin I (High Sensitivity): 1608 ng/L (ref <18)

## 2018-07-28 LAB — SARS CORONAVIRUS 2 BY RT PCR (HOSPITAL ORDER, PERFORMED IN ~~LOC~~ HOSPITAL LAB): SARS Coronavirus 2: NEGATIVE

## 2018-07-28 LAB — MRSA PCR SCREENING: MRSA by PCR: NEGATIVE

## 2018-07-28 LAB — POCT ACTIVATED CLOTTING TIME: Activated Clotting Time: 720 seconds

## 2018-07-28 LAB — HEMOGLOBIN A1C
Hgb A1c MFr Bld: 5.6 % (ref 4.8–5.6)
Mean Plasma Glucose: 114.02 mg/dL

## 2018-07-28 LAB — APTT: aPTT: 41 seconds — ABNORMAL HIGH (ref 24–36)

## 2018-07-28 LAB — GLUCOSE, CAPILLARY: Glucose-Capillary: 92 mg/dL (ref 70–99)

## 2018-07-28 SURGERY — LEFT HEART CATH AND CORONARY ANGIOGRAPHY
Anesthesia: LOCAL

## 2018-07-28 MED ORDER — IOHEXOL 350 MG/ML SOLN
INTRAVENOUS | Status: DC | PRN
  Administered 2018-07-28: 205 mL via INTRAVENOUS

## 2018-07-28 MED ORDER — SODIUM CHLORIDE 0.9% FLUSH
3.0000 mL | Freq: Two times a day (BID) | INTRAVENOUS | Status: DC
  Administered 2018-07-29 – 2018-08-05 (×14): 3 mL via INTRAVENOUS

## 2018-07-28 MED ORDER — VERAPAMIL HCL 2.5 MG/ML IV SOLN
INTRAVENOUS | Status: DC | PRN
  Administered 2018-07-28: 10 mL via INTRA_ARTERIAL

## 2018-07-28 MED ORDER — TICAGRELOR 90 MG PO TABS
ORAL_TABLET | ORAL | Status: AC
  Filled 2018-07-28: qty 1

## 2018-07-28 MED ORDER — HYDRALAZINE HCL 20 MG/ML IJ SOLN: 10.0000 mg | INTRAMUSCULAR | Status: AC | PRN

## 2018-07-28 MED ORDER — LABETALOL HCL 5 MG/ML IV SOLN: 10.0000 mg | INTRAVENOUS | Status: AC | PRN

## 2018-07-28 MED ORDER — ZOLPIDEM TARTRATE 5 MG PO TABS: 5.0000 mg | ORAL_TABLET | Freq: Every evening | ORAL | Status: DC | PRN

## 2018-07-28 MED ORDER — HEPARIN (PORCINE) IN NACL 1000-0.9 UT/500ML-% IV SOLN
INTRAVENOUS | Status: DC | PRN
  Administered 2018-07-28 (×2): 500 mL

## 2018-07-28 MED ORDER — SODIUM CHLORIDE 0.9 % IV SOLN: 250.0000 mL | INTRAVENOUS | Status: DC | PRN

## 2018-07-28 MED ORDER — MIDAZOLAM HCL 2 MG/2ML IJ SOLN
INTRAMUSCULAR | Status: AC
  Filled 2018-07-28: qty 2

## 2018-07-28 MED ORDER — VERAPAMIL HCL 2.5 MG/ML IV SOLN
INTRAVENOUS | Status: AC
  Filled 2018-07-28: qty 2

## 2018-07-28 MED ORDER — ASPIRIN 81 MG PO CHEW
81.0000 mg | CHEWABLE_TABLET | Freq: Every day | ORAL | Status: DC
  Administered 2018-07-29 – 2018-08-05 (×8): 81 mg via ORAL
  Filled 2018-07-28 (×8): qty 1

## 2018-07-28 MED ORDER — SODIUM CHLORIDE 0.9 % IV SOLN
INTRAVENOUS | Status: DC | PRN
  Administered 2018-07-28: 1.75 mg/kg/h
  Administered 2018-07-28: 1.75 mg/kg/h via INTRAVENOUS

## 2018-07-28 MED ORDER — FENTANYL CITRATE (PF) 100 MCG/2ML IJ SOLN
INTRAMUSCULAR | Status: AC
  Filled 2018-07-28: qty 2

## 2018-07-28 MED ORDER — SODIUM CHLORIDE 0.9 % IV SOLN: INTRAVENOUS | Status: DC

## 2018-07-28 MED ORDER — TICAGRELOR 90 MG PO TABS
ORAL_TABLET | ORAL | Status: DC | PRN
  Administered 2018-07-28: 180 mg via ORAL
  Administered 2018-07-29: 90 mg

## 2018-07-28 MED ORDER — HEPARIN SODIUM (PORCINE) 1000 UNIT/ML IJ SOLN
INTRAMUSCULAR | Status: AC
  Filled 2018-07-28: qty 1

## 2018-07-28 MED ORDER — LIDOCAINE HCL (PF) 1 % IJ SOLN
INTRAMUSCULAR | Status: DC | PRN
  Administered 2018-07-28: 2 mL

## 2018-07-28 MED ORDER — BIVALIRUDIN TRIFLUOROACETATE 250 MG IV SOLR
INTRAVENOUS | Status: AC
  Filled 2018-07-28: qty 250

## 2018-07-28 MED ORDER — ONDANSETRON HCL 4 MG/2ML IJ SOLN: 4.0000 mg | Freq: Four times a day (QID) | INTRAMUSCULAR | Status: DC | PRN

## 2018-07-28 MED ORDER — FENTANYL CITRATE (PF) 100 MCG/2ML IJ SOLN
INTRAMUSCULAR | Status: DC | PRN
  Administered 2018-07-28 (×2): 25 ug via INTRAVENOUS

## 2018-07-28 MED ORDER — ACETAMINOPHEN 325 MG PO TABS: 650.0000 mg | ORAL_TABLET | ORAL | Status: DC | PRN

## 2018-07-28 MED ORDER — SODIUM CHLORIDE 0.9 % IV SOLN
1.7500 mg/kg/h | INTRAVENOUS | Status: AC
  Administered 2018-07-28: 1.75 mg/kg/h via INTRAVENOUS
  Filled 2018-07-28 (×2): qty 250

## 2018-07-28 MED ORDER — SODIUM CHLORIDE 0.9 % IV SOLN
INTRAVENOUS | Status: DC | PRN
  Administered 2018-07-28: 100 mL/h via INTRAVENOUS
  Administered 2018-07-29: 500 mL

## 2018-07-28 MED ORDER — HEPARIN (PORCINE) IN NACL 1000-0.9 UT/500ML-% IV SOLN
INTRAVENOUS | Status: AC
  Filled 2018-07-28: qty 1000

## 2018-07-28 MED ORDER — TICAGRELOR 90 MG PO TABS
90.0000 mg | ORAL_TABLET | Freq: Two times a day (BID) | ORAL | Status: DC
  Administered 2018-07-29 – 2018-08-05 (×14): 90 mg via ORAL
  Filled 2018-07-28 (×15): qty 1

## 2018-07-28 MED ORDER — NITROGLYCERIN 1 MG/10 ML FOR IR/CATH LAB
INTRA_ARTERIAL | Status: DC | PRN
  Administered 2018-07-28 (×5): 200 ug via INTRACORONARY

## 2018-07-28 MED ORDER — SODIUM CHLORIDE 0.9% FLUSH: 3.0000 mL | INTRAVENOUS | Status: DC | PRN

## 2018-07-28 MED ORDER — ROSUVASTATIN CALCIUM 20 MG PO TABS
40.0000 mg | ORAL_TABLET | Freq: Every day | ORAL | Status: DC
  Administered 2018-07-28 – 2018-08-04 (×8): 40 mg via ORAL
  Filled 2018-07-28 (×9): qty 2

## 2018-07-28 MED ORDER — NITROGLYCERIN 1 MG/10 ML FOR IR/CATH LAB
INTRA_ARTERIAL | Status: AC
  Filled 2018-07-28: qty 10

## 2018-07-28 MED ORDER — BIVALIRUDIN BOLUS VIA INFUSION - CUPID
INTRAVENOUS | Status: DC | PRN
  Administered 2018-07-28: 58.2 mg via INTRAVENOUS

## 2018-07-28 MED ORDER — MIDAZOLAM HCL 2 MG/2ML IJ SOLN
INTRAMUSCULAR | Status: DC | PRN
  Administered 2018-07-28 (×2): 1 mg via INTRAVENOUS

## 2018-07-28 MED ORDER — LIDOCAINE HCL (PF) 1 % IJ SOLN
INTRAMUSCULAR | Status: AC
  Filled 2018-07-28: qty 30

## 2018-07-28 SURGICAL SUPPLY — 18 items
BALLN SAPPHIRE 2.0X20 (BALLOONS) ×2
BALLN ~~LOC~~ EMERGE MR 3.25X20 (BALLOONS) ×2
BALLOON SAPPHIRE 2.0X20 (BALLOONS) IMPLANT
BALLOON ~~LOC~~ EMERGE MR 3.25X20 (BALLOONS) IMPLANT
CATH OPTITORQUE TIG 4.0 5F (CATHETERS) ×1 IMPLANT
CATH VISTA GUIDE 6FR XBLAD3.5 (CATHETERS) ×1 IMPLANT
DEVICE RAD COMP TR BAND LRG (VASCULAR PRODUCTS) ×1 IMPLANT
GLIDESHEATH SLEND SS 6F .021 (SHEATH) ×1 IMPLANT
GUIDEWIRE INQWIRE 1.5J.035X260 (WIRE) IMPLANT
INQWIRE 1.5J .035X260CM (WIRE) ×2
KIT ENCORE 26 ADVANTAGE (KITS) ×1 IMPLANT
KIT HEART LEFT (KITS) ×2 IMPLANT
PACK CARDIAC CATHETERIZATION (CUSTOM PROCEDURE TRAY) ×2 IMPLANT
STENT RESOLUTE ONYX3.0X38 (Permanent Stent) ×1 IMPLANT
SYR MEDRAD MARK 7 150ML (SYRINGE) ×2 IMPLANT
TRANSDUCER W/STOPCOCK (MISCELLANEOUS) ×2 IMPLANT
TUBING CIL FLEX 10 FLL-RA (TUBING) ×2 IMPLANT
WIRE COUGAR XT STRL 190CM (WIRE) ×1 IMPLANT

## 2018-07-28 NOTE — ED Triage Notes (Signed)
Pt from home c.o central non radiating chest pain for the past 3 hours, ems administered 324 ASA and 2 nitros en route. Pain went from 10/10 to 3/10. 2 18G IVs in left arm. Pt arrives to ed alert, pt transported to cath lab

## 2018-07-28 NOTE — H&P (Addendum)
Cardiology Admission History and Physical:   Patient ID: Austin Mora MRN: [id number]; DOB: [birth year]   Admission date: 07/28/2018  Primary Care Provider: Leanna Battles, MD Primary Cardiologist: No primary care provider on file.  Primary Electrophysiologist:  None   Chief Complaint:  Chest pain  Patient Profile:   Austin Mora is a 73 y.o. male with depression, DM, GERD, HL, HTN who presented with chest pain and CODE STEMI called in the field.   History of Present Illness:   Austin Mora is a 73 yo male with PMH noted above. He lives at home with his wife. Reported to have some issues with his memory and recently started on Aricept as an outpatient. Being followed by neurology. Reports being in his usual state of health until this morning. Developed centralized chest pain with some shortness of breath. No radiation into the jaw or arms. Called EMS when symptoms did not resolve. EKG showed SR with large TWI in precordial leads with slight STE in anterior leads. CODE STEMI called in the field. He was given 324 ASA and 2 SL nitro en route. Brought directly to the cath lab for emergent cardiac catheterization.   Heart Pathway Score:     Past Medical History:  Diagnosis Date  . Depression   . Diabetes mellitus    type 2  . GERD (gastroesophageal reflux disease)   . Hyperlipidemia   . Hypertension   . Memory disorder 07/01/2018    Past Surgical History:  Procedure Laterality Date  . APPENDECTOMY  1960  . CHOLECYSTECTOMY  1995  . HEMORROIDECTOMY  1998  . SHOULDER ARTHROSCOPY W/ ROTATOR CUFF REPAIR  2010   left     Medications Prior to Admission: Prior to Admission medications   Medication Sig Start Date End Date Taking? Authorizing Provider  ARIPiprazole (ABILIFY) 5 MG tablet  05/05/18   [provider]  aspirin 81 MG tablet Take 81 mg by mouth daily.    [provider]  escitalopram (LEXAPRO) 10 MG tablet Take 10 mg by mouth daily. 05/08/18   [provider]  omeprazole (PRILOSEC) 40 MG capsule Take 1 capsule by mouth daily.  04/30/11   [provider]  rosuvastatin (CRESTOR) 20 MG tablet Take 20 mg by mouth daily. Every evening after dinner    [provider]     Allergies:    Allergies  Allergen Reactions  . Aricept [Donepezil Hcl]     Vivid dreams  . Sulfa Antibiotics Hives    Social History:   Social History   Socioeconomic History  . Marital status: Single    Spouse name: Not on file  . Number of children: Not on file  . Years of education: 51  . Highest education level: Not on file  Occupational History  . Occupation: Part time - [employer]  Social Needs  . Financial resource strain: Not on file  . Food insecurity    Worry: Not on file    Inability: Not on file  . Transportation needs    Medical: Not on file    Non-medical: Not on file  Tobacco Use  . Smoking status: Current Every Day Smoker    Packs/day: 0.50  . Smokeless tobacco: Never Used  Substance and Sexual Activity  . Alcohol use: No  . Drug use: No  . Sexual activity: Not on file  Lifestyle  . Physical activity    Days per week: Not on file    Minutes per session: Not  on file  . Stress: Not on file  Relationships  . Social Herbalist on phone: Not on file    Gets together: Not on file    Attends religious service: Not on file    Active member of club or organization: Not on file    Attends meetings of clubs or organizations: Not on file    Relationship status: Not on file  . Intimate partner violence    Fear of current or ex partner: Not on file    Emotionally abused: Not on file    Physically abused: Not on file    Forced sexual activity: Not on file  Other Topics Concern  . Not on file  Social History Narrative   Lives with wife   Caffeine use: 2 cups per day (coffee)   Left handed     Family History:   The patient's family history includes Heart attack in his brother and father.     ROS:  Please see the history of present illness.  All other ROS reviewed and negative.     Physical Exam/Data:  There were no vitals filed for this visit. No intake or output data in the 24 hours ending 07/28/18 1622 Last 3 Weights 07/16/2018 07/01/2018 11/19/2017  Weight (lbs) 171 lb 186 lb 191 lb 8 oz  Weight (kg) 77.565 kg 84.369 kg 86.864 kg     There is no height or weight on file to calculate BMI.  General:  Well nourished, well developed, older WM, diaphoretic HEENT: normal Lymph: no adenopathy Neck: no JVD Cardiac:  normal S1, S2; RRR; no murmur  Lungs:  clear to auscultation bilaterally, no wheezing, rhonchi or rales  Abd: soft, nontender, no hepatomegaly  Ext: no edema Musculoskeletal:  No deformities, BUE and BLE strength normal and equal Skin: warm and dry  Neuro:  CNs 2-12 intact, no focal abnormalities noted Psych:  Normal affect    EKG:  The ECG that was done 07/28/18 was personally reviewed and demonstrates SR with TWI in precordial leads, STE in anterior leads.   Relevant CV Studies:  N/a  Laboratory Data:  High Sensitivity Troponin:  No results for input(s): TROPONINIHS in the last 720 hours.    Cardiac EnzymesNo results for input(s): TROPONINI in the last 168 hours. No results for input(s): TROPIPOC in the last 168 hours.  ChemistryNo results for input(s): NA, K, CL, CO2, GLUCOSE, BUN, CREATININE, CALCIUM, GFRNONAA, GFRAA, ANIONGAP in the last 168 hours.  No results for input(s): PROT, ALBUMIN, AST, ALT, ALKPHOS, BILITOT in the last 168 hours. Hematology Recent Labs  Lab 07/28/18 1546  WBC 7.4  RBC 5.61  HGB 16.5  HCT 49.3  MCV 87.9  MCH 29.4  MCHC 33.5  RDW 12.5  PLT 131*   BNPNo results for input(s): BNP, PROBNP in the last 168 hours.  DDimer No results for input(s): DDIMER in the last 168 hours.   Radiology/Studies:  No results found.  Assessment and Plan:   Austin Mora is a 73 y.o. male with depression, DM, GERD, HL, HTN who  presented with chest pain and CODE STEMI called in the field.   STEMI: Symptoms started about 3 hours prior to admission. EKG with large TWI in precordial leads and STE in anterior leads. Given 324mg  ASA and 2 SL nitro PTA. Brought directly to the cath lab for emergent cardiac cath. Further recommendations post cath.   Severity of Illness: The appropriate patient status for this patient is INPATIENT.  Inpatient status is judged to be reasonable and necessary in order to provide the required intensity of service to ensure the patient's safety. The patient's presenting symptoms, physical exam findings, and initial radiographic and laboratory data in the context of their chronic comorbidities is felt to place them at high risk for further clinical deterioration. Furthermore, it is not anticipated that the patient will be medically stable for discharge from the hospital within 2 midnights of admission. The following factors support the patient status of inpatient.   " The patient's presenting symptoms include chest pain. " The worrisome physical exam findings include stable, but diaphoretic. " The initial radiographic and laboratory data are worrisome because of EKG with anterior STEMO. " The chronic co-morbidities include HTN, DM, HL.   * I certify that at the point of admission it is my clinical judgment that the patient will require inpatient hospital care spanning beyond 2 midnights from the point of admission due to high intensity of service, high risk for further deterioration and high frequency of surveillance required.*    For questions or updates, please contact [place] Please consult [email or web address] for contact info under     Signed, Reino Bellis, NP  07/28/2018 4:22 PM   Patient seen and examined. Agree with assessment and plan.  Austin Mora is a 73 year old gentleman who has a long history of tobacco use, history of hyperlipidemia, early dementia developed new onset chest  tightness today.  His chest pain persisted.  ECG in the field showed sinus rhythm with 1 mm ST elevation anteroseptally with diffuse precordial T wave inversion V2 through V6 as well as in leads I and L.  He also has QS complex.  His chest pain had slightly improved upon arrival to the Cath Lab but was still present.  He had stopped in the emergency room for COVID-19 testing and presented for emergent cardiac catheterization.  Found to have subtotal long stenosis of a large LAD vessel in the proximal to mid segment with reduced TIMI-3 flow as well as concomitant disease in the distal circumflex and mildly in the RCA.  He underwent successful stenting of his LAD with ultimate insertion of a 3.0 x 30 mm Resolute Onyx stent postdilated to 3.3 mm.  Angiography confirmed an excellent result with restoration of TIMI-3 flow.  Since he did have reduced flow at the start of the procedure including the distal circumflex he will be maintained on Angiomax for 4 hours post procedure.  In the lab he had received Brilinta crossed format.  I had spoken with the patient's daughter who resides in [place] following the procedure.  Patient will undergo 2D echo Doppler assessment.  Medical therapy for concomitant CAD and  titrated his rosuvastatin up to 40 mg.  Smoking cessation is essential.    Troy Sine, MD, [facility] 07/28/2018 5:39 PM

## 2018-07-29 ENCOUNTER — Encounter (HOSPITAL_COMMUNITY): Payer: Self-pay | Admitting: Cardiovascular Disease

## 2018-07-29 ENCOUNTER — Inpatient Hospital Stay (HOSPITAL_COMMUNITY): Payer: Medicare PPO

## 2018-07-29 DIAGNOSIS — I2102 ST elevation (STEMI) myocardial infarction involving left anterior descending coronary artery: Secondary | ICD-10-CM

## 2018-07-29 LAB — POCT I-STAT 7, (LYTES, BLD GAS, ICA,H+H)
Acid-base deficit: 3 mmol/L — ABNORMAL HIGH (ref 0.0–2.0)
Bicarbonate: 22.7 mmol/L (ref 20.0–28.0)
Calcium, Ion: 1.26 mmol/L (ref 1.15–1.40)
HCT: 50 % (ref 39.0–52.0)
Hemoglobin: 17 g/dL (ref 13.0–17.0)
O2 Saturation: 96 %
Potassium: 3.8 mmol/L (ref 3.5–5.1)
Sodium: 141 mmol/L (ref 135–145)
TCO2: 24 mmol/L (ref 22–32)
pCO2 arterial: 40.5 mmHg (ref 32.0–48.0)
pH, Arterial: 7.357 (ref 7.350–7.450)
pO2, Arterial: 88 mmHg (ref 83.0–108.0)

## 2018-07-29 LAB — CBC
HCT: 47 % (ref 39.0–52.0)
Hemoglobin: 15.9 g/dL (ref 13.0–17.0)
MCH: 29.6 pg (ref 26.0–34.0)
MCHC: 33.8 g/dL (ref 30.0–36.0)
MCV: 87.4 fL (ref 80.0–100.0)
Platelets: 132 10*3/uL — ABNORMAL LOW (ref 150–400)
RBC: 5.38 MIL/uL (ref 4.22–5.81)
RDW: 12.4 % (ref 11.5–15.5)
WBC: 8.8 10*3/uL (ref 4.0–10.5)
nRBC: 0 % (ref 0.0–0.2)

## 2018-07-29 LAB — GLUCOSE, CAPILLARY
Glucose-Capillary: 78 mg/dL (ref 70–99)
Glucose-Capillary: 113 mg/dL — ABNORMAL HIGH (ref 70–99)

## 2018-07-29 LAB — BASIC METABOLIC PANEL
Anion gap: 12 (ref 5–15)
BUN: 10 mg/dL (ref 8–23)
CO2: 21 mmol/L — ABNORMAL LOW (ref 22–32)
Calcium: 9.1 mg/dL (ref 8.9–10.3)
Chloride: 104 mmol/L (ref 98–111)
Creatinine, Ser: 1.02 mg/dL (ref 0.61–1.24)
GFR calc Af Amer: >60 mL/min (ref >60)
GFR calc non Af Amer: >60 mL/min (ref >60)
Glucose, Bld: 73 mg/dL (ref 70–99)
Potassium: 3.6 mmol/L (ref 3.5–5.1)
Sodium: 137 mmol/L (ref 135–145)

## 2018-07-29 LAB — POCT I-STAT CREATININE: Creatinine, Ser: 0.8 mg/dL (ref 0.61–1.24)

## 2018-07-29 LAB — ECHOCARDIOGRAM COMPLETE
Height: 69 in
Weight: 2691.38 oz

## 2018-07-29 MED ORDER — ANGIOPLASTY BOOK
Freq: Once | Status: AC
  Administered 2018-07-29: 23:00:00
  Filled 2018-07-29: qty 1

## 2018-07-29 MED ORDER — CHLORHEXIDINE GLUCONATE CLOTH 2 % EX PADS: 6.0000 | MEDICATED_PAD | Freq: Every day | CUTANEOUS | Status: DC

## 2018-07-29 MED ORDER — HEART ATTACK BOUNCING BOOK
Freq: Once | Status: AC
  Administered 2018-07-29: 23:00:00
  Filled 2018-07-29: qty 1

## 2018-07-29 MED FILL — Heparin Sodium (Porcine) Inj 1000 Unit/ML: INTRAMUSCULAR | Qty: 10 | Status: AC

## 2018-07-29 NOTE — Progress Notes (Addendum)
CARDIAC REHAB PHASE I   PRE:  Rate/Rhythm: 66 SR  BP:  Supine:   Sitting: 132/67, 127/74  Standing:    SaO2: 95%RA  MODE:  Ambulation: 270 ft   POST:  Rate/Rhythm: 84 SR  BP:  Supine:   Sitting: 125/71  Standing:    SaO2: 95%RA 0845-1005 Pt walked 270 ft on RA with gait belt use and asst x 1. Gait a little wobbly. Denied CP. To bed after walk since for ECHO soon. Pt has a little difficulty comprehending information. Thought he had been moved already to his new room after walk. Had condom cath but did not realize he could just void. Had daughter on phone at one time and I had pt tell her how important the brilinta is. Turned down pages in MI booklet and gave written information on smoking cessation, CRP 2, heart healthy diet and gave stent card. Pt stated we could do his education in portions which would be better for him. Stressed importance of brilinta several times and pt able to answer teachback on it. Discussed MI restrictions, stent, CRP 2 [place] and smoking cessation. Pt has cut down to 10 cigarettes from pack and a half. Encouraged him to continue to quit. Pt not good with Apps so will not sign up for virtual program. Will follow up tomorrow to continue to walk and reinforce ed.  Checklist:  1. Pt has smart device  ie smartphone and/or ipad for downloading an app  Yes 2. Reliable internet/wifi service    No 3. Understands how to use their smartphone and navigate within an app.  No       Graylon Good, RN BSN  07/29/2018 9:59 AM

## 2018-07-29 NOTE — Care Management (Signed)
Brilinta benefits check sent and pending.  Preciosa Bundrick RN, BSN, NCM-BC, ACM-RN [phone] 

## 2018-07-29 NOTE — Progress Notes (Addendum)
Progress Note  Patient Name: Austin Mora Date of Encounter: 07/29/2018  Primary Cardiologist: Dr. Ellouise Newer  Subjective   Postop day 1 anterior STEMI treated with PCI and drug-eluting stenting of the proximal LAD.  Patient is off all drips.  He denies chest pain or shortness of breath.  Inpatient Medications    Scheduled Meds: . aspirin  81 mg Oral Daily  . rosuvastatin  40 mg Oral q1800  . sodium chloride flush  3 mL Intravenous Q12H  . ticagrelor  90 mg Oral BID   Continuous Infusions: . sodium chloride 75 mL/hr at 07/29/18 0600  . sodium chloride     PRN Meds: sodium chloride, acetaminophen, ondansetron (ZOFRAN) IV, sodium chloride flush, zolpidem   Vital Signs    Vitals:   07/29/18 0600 07/29/18 0700 07/29/18 0800 07/29/18 0819  BP: (!) 149/63 (!) 141/59 132/67   Pulse: 63 69 66   Resp: 13 (!) 21 13   Temp:    97.7 F (36.5 C)  TempSrc:    Oral  SpO2: 96% 96% 96%   Weight:      Height:        Intake/Output Summary (Last 24 hours) at 07/29/2018 0858 Last data filed at 07/29/2018 0700 Gross per 24 hour  Intake 1159.87 ml  Output 300 ml  Net 859.87 ml   Last 3 Weights 07/28/2018 07/16/2018 07/01/2018  Weight (lbs) 168 lb 3.4 oz 171 lb 186 lb  Weight (kg) 76.3 kg 77.565 kg 84.369 kg      Telemetry    Normal sinus rhythm- Personally Reviewed  ECG    Normal sinus rhythm at 67 with left axis deviation and anterolateral T wave inversion- Personally Reviewed  Physical Exam   GEN: No acute distress.   Neck: No JVD Cardiac: RRR, no murmurs, rubs, or gallops.  Respiratory: Clear to auscultation bilaterally. GI: Soft, nontender, non-distended  MS: No edema; No deformity. Neuro:  Nonfocal  Psych: Normal affect   Labs    High Sensitivity Troponin:   Recent Labs  Lab 07/28/18 1546 07/28/18 1815 07/28/18 2046  TROPONINIHS 513* 860* 1,608*      Cardiac EnzymesNo results for input(s): TROPONINI in the last 168 hours. No results for input(s):  TROPIPOC in the last 168 hours.   Chemistry Recent Labs  Lab 07/28/18 1546 07/28/18 1551 07/28/18 1552 07/29/18 0251  NA 139 141  --  137  K 3.8 3.8  --  3.6  CL 104  --   --  104  CO2 22  --   --  21*  GLUCOSE 108*  --   --  73  BUN 14  --   --  10  CREATININE 1.24  --  0.80 1.02  CALCIUM 9.4  --   --  9.1  PROT 6.7  --   --   --   ALBUMIN 4.3  --   --   --   AST 32  --   --   --   ALT 26  --   --   --   ALKPHOS 65  --   --   --   BILITOT 2.0*  --   --   --   GFRNONAA 57*  --   --  >60  GFRAA >60  --   --  >60  ANIONGAP 13  --   --  12     Hematology Recent Labs  Lab 07/28/18 1546 07/28/18 1551 07/29/18 0251  WBC 7.4  --  8.8  RBC 5.61  --  5.38  HGB 16.5 17.0 15.9  HCT 49.3 50.0 47.0  MCV 87.9  --  87.4  MCH 29.4  --  29.6  MCHC 33.5  --  33.8  RDW 12.5  --  12.4  PLT 131*  --  132*    BNPNo results for input(s): BNP, PROBNP in the last 168 hours.   DDimer No results for input(s): DDIMER in the last 168 hours.   Radiology    No results found.  Cardiac Studies   Cardiac catheterization (07/28/2018)  Conclusion    Ost LAD lesion is 20% stenosed.  2nd LPL lesion is 80% stenosed.  LPDA lesion is 80% stenosed.  Dist LAD-1 lesion is 30% stenosed.  Dist LAD-2 lesion is 50% stenosed.  Prox LAD to Mid LAD lesion is 90% stenosed.  Post intervention, there is a 0% residual stenosis.  Prox RCA lesion is 30% stenosed.  A stent was successfully placed.   Acute coronary syndrome with initial ST elevation anteriorly followed by diffuse T wave inversion across the entire precordium and lateral leads secondary to long subtotal stenosis of the proximal to mid LAD in a large wrap around LAD.  There was initial TIMI II flow which deteriorated to TIMI I flow prior to the intervention.   Dominant left circumflex vessel with diffuse mid PDA stenosis just proximal to a bifurcation.  Mild 30% stenosis in the proximal RCA.  Successful PCI to the LAD with  ultimate insertion of a 3.0 x 38 mm Resolute Onyx DES stent postdilated to 3.3 mm with a diffuse 90% long stenosis being reduced to 0% and improving to brisk TIMI-3 flow.  There is no change in his 20% ostial LAD stenosis 30% and 50% distal LAD stenoses following intervention.  RECOMMENDATION: DAPT for minimum of 12 months.  We will continue bivalirudin 4 hours post procedure.  Smoking cessation is essential.  Will titrate rosuvastatin to 40 mg.   Echo Doppler study to assess LV systolic and diastolic function.     Patient Profile     73 y.o. married Caucasian male with a history of depression, hyperlipidemia on Crestor who was admitted yesterday with an anterior STEMI.  He had anterolateral T wave inversion.  He is brought emergently to the Cath Lab by Dr. Claiborne Billings and cath radially revealing a a segmental 95% subtotally occluded LAD which was stented.  He had minimal disease otherwise.  He currently is pain-free.  Assessment & Plan    1: Coronary artery disease- postop day 1 anterior STEMI treated with PCI and drug-eluting stenting of the proximal LAD using a 3 mm x 38 mm long resolute Onyx drug-eluting stent.  He had an excellent angiographic result.  He had minimal disease otherwise.  He is on dual antiplatelet therapy which she will he will need to be on for 12 months uninterrupted.  A 2D echo is pending.  His troponin was 1600.  2: Hyperlipidemia- on Crestor at home with excellent lipid findings  3: Tobacco abuse-counseled about the importance of smoking cessation  Patient appears stable on postop day 1 anterior STEMI.  Will transfer to telemetry today.  I have gotten cardiac rehab to see and will ambulate with the intention to potentially discharge home tomorrow.  For questions or updates, please contact [place] Please consult [email or web address] for contact info under        Signed, Quay Burow, MD  07/29/2018, 8:58 AM     Agree with note by  Reino Bellis NP-C  Postop day  #2 anterior STEMI treated with PCI drug-eluting stenting of the proximal LAD by Dr. Claiborne Billings.  2D echo revealed EF of 30%.  He is begun on Coreg.  Is hemodynamically stable.  Denies chest pain.  He is on appropriate medications including dual antiplatelet therapy and statins.  He is somewhat cognitively slow I do not think he be a good candidate for a "LifeVest".  Plan continue ambulation with cardiac rehab and physical therapy today.  I anticipate discharge home tomorrow.  He will need a repeat 2D echocardiogram in 3 months to assess improvement LV function.   Lorretta Harp, M.D., [place], [facility], [place] [address] [place], [place]  [address]  [phone] 07/30/2018 9:26 AM

## 2018-07-29 NOTE — TOC Benefit Eligibility Note (Signed)
Transition of Care [facility]) Benefit Eligibility Note    Patient Details  Name: Austin Mora MRN: [id number] Date of Birth: Mar 04, 1945   Medication/Dose: BRILINTA  90 MG BID  Covered?: Yes  Tier: 3 Drug  Prescription Coverage Preferred Pharmacy: [facility]  AND [facility]   ,  90 DAY  SUPPLY FOR M/O $249.96  Spoke with Person/Company/Phone Number:: VEJAY  @ [employer] #  [phone]  Co-Pay: [id number]  Prior Approval: No  Deductible: Unmet  Additional Notes: TICAGRELOR  : Austin Mora Phone Number: 07/29/2018, 12:36 PM

## 2018-07-29 NOTE — Plan of Care (Signed)

## 2018-07-29 NOTE — Progress Notes (Signed)
  Echocardiogram 2D Echocardiogram has been performed.  Jennette Dubin 07/29/2018, 10:18 AM

## 2018-07-30 ENCOUNTER — Encounter (HOSPITAL_COMMUNITY): Payer: Self-pay | Admitting: *Deleted

## 2018-07-30 MED ORDER — CARVEDILOL 3.125 MG PO TABS
3.1250 mg | ORAL_TABLET | Freq: Two times a day (BID) | ORAL | Status: DC
  Administered 2018-07-30 – 2018-08-05 (×13): 3.125 mg via ORAL
  Filled 2018-07-30 (×13): qty 1

## 2018-07-30 NOTE — Evaluation (Signed)
Physical Therapy Evaluation Patient Details Name: Austin Mora MRN: [id number] DOB: 20-May-1945 Today's Date: 07/30/2018   History of Present Illness  Pt is a 73 y/o male admitted secondary to chest pain. Found to have STEMI and s/p heart cath with stent placement. PMH includes DM, HTN, GERD, and memory disorder.   Clinical Impression  Pt admitted secondary to problem above with deficits below. Pt limited secondary to fatigue this session. Performed gait within the room. Pt unsteady, requiring min A for steadying with use of RW. Pt also presenting with cognitive deficits. Per pt and RN, pt's wife currently in the hospital and will not be able to provide necessary assist. Will continue to follow acutely to maximize functional mobility independence and safety.     Follow Up Recommendations SNF;Supervision/Assistance - 24 hour    Equipment Recommendations  Rolling walker with 5" wheels    Recommendations for Other Services       Precautions / Restrictions Precautions Precautions: Fall Restrictions Weight Bearing Restrictions: No      Mobility  Bed Mobility Overal bed mobility: Needs Assistance Bed Mobility: Supine to Sit     Supine to sit: Min assist     General bed mobility comments: Min A for trunk elevation. Increased time required to come to EOB.   Transfers Overall transfer level: Needs assistance Equipment used: Rolling walker (2 wheeled) Transfers: Sit to/from Stand Sit to Stand: Min assist         General transfer comment: Min A for lift assist and steadying. Cues for safe hand placement. Cues to stand upright, as pt with very flexed posture.   Ambulation/Gait Ambulation/Gait assistance: Min assist Gait Distance (Feet): 15 Feet Assistive device: Rolling walker (2 wheeled) Gait Pattern/deviations: Shuffle;Decreased stride length;Trunk flexed Gait velocity: Decreased    General Gait Details: Pt with slow, shuffle type gait. Min A for steadying assist.  Required multimodal cues for sequencing using RW.   Stairs            Wheelchair Mobility    Modified Rankin (Stroke Patients Only)       Balance Overall balance assessment: Needs assistance Sitting-balance support: No upper extremity supported;Feet supported Sitting balance-Leahy Scale: Fair     Standing balance support: Bilateral upper extremity supported;During functional activity Standing balance-Leahy Scale: Poor Standing balance comment: Reliant on BUE support                              Pertinent Vitals/Pain Pain Assessment: No/denies pain    Home Living Family/patient expects to be discharged to:: Private residence Living Arrangements: Spouse/significant other Available Help at Discharge: Family;Available PRN/intermittently Type of Home: Apartment Home Access: Stairs to enter Entrance Stairs-Rails: Psychiatric nurse of Steps: flight Home Layout: One level Home Equipment: None Additional Comments: Per pt and RN, pt's wife is also in hospital for broken wrist and will not be able to assist with pt care. Daughter will be coming for 2 days, but cannot stay.     Prior Function Level of Independence: Independent               Hand Dominance        Extremity/Trunk Assessment   Upper Extremity Assessment Upper Extremity Assessment: Generalized weakness    Lower Extremity Assessment Lower Extremity Assessment: Generalized weakness    Cervical / Trunk Assessment Cervical / Trunk Assessment: Kyphotic  Communication   Communication: No difficulties  Cognition Arousal/Alertness: Awake/alert Behavior During Therapy: Flat  affect Overall Cognitive Status: No family/caregiver present to determine baseline cognitive functioning                                 General Comments: Pt with very slowed processing and noted difficulty with memory. Pt asking multiple times about buying used walker and if insurance  would pay for a new one.       General Comments      Exercises     Assessment/Plan    PT Assessment Patient needs continued PT services  PT Problem List Decreased cognition;Decreased safety awareness;Decreased knowledge of precautions;Decreased knowledge of use of DME;Decreased mobility;Decreased balance;Decreased strength;Decreased activity tolerance       PT Treatment Interventions DME instruction;Gait training;Therapeutic activities;Functional mobility training;Stair training;Therapeutic exercise;Balance training;Patient/family education;Cognitive remediation    PT Goals (Current goals can be found in the Care Plan section)  Acute Rehab PT Goals Patient Stated Goal: to get some rest PT Goal Formulation: With patient Time For Goal Achievement: 08/13/18 Potential to Achieve Goals: Good    Frequency Min 2X/week   Barriers to discharge Decreased caregiver support      Co-evaluation               AM-PAC PT "6 Clicks" Mobility  Outcome Measure Help needed turning from your back to your side while in a flat bed without using bedrails?: A Little Help needed moving from lying on your back to sitting on the side of a flat bed without using bedrails?: A Little Help needed moving to and from a bed to a chair (including a wheelchair)?: A Little Help needed standing up from a chair using your arms (e.g., wheelchair or bedside chair)?: A Little Help needed to walk in hospital room?: A Little Help needed climbing 3-5 steps with a railing? : A Lot 6 Click Score: 17    End of Session Equipment Utilized During Treatment: Gait belt Activity Tolerance: Patient limited by fatigue Patient left: in chair;with call bell/phone within reach;with chair alarm set Nurse Communication: Mobility status PT Visit Diagnosis: Unsteadiness on feet (R26.81);Muscle weakness (generalized) (M62.81);Difficulty in walking, not elsewhere classified (R26.2)    Time: 2297-9892 PT Time Calculation  (min) (ACUTE ONLY): 21 min   Charges:   PT Evaluation $PT Eval Low Complexity: [place], PT, DPT  Acute Rehabilitation Services  Pager: [phone] Office: [phone]   Rudean Hitt 07/30/2018, 1:19 PM

## 2018-07-30 NOTE — Progress Notes (Signed)
CARDIAC REHAB PHASE I   PRE:  Rate/Rhythm: 81 SR  BP:  Supine:   Sitting: 106/69  Standing:    SaO2: 98%RA  MODE:  Ambulation: 350 ft   POST:  Rate/Rhythm: 84 SR  BP:  Supine: 129/79  Sitting:   Standing:    SaO2: 98%RA 0940-1025 Pt's RN walking pt to bathroom with rolling walker when I entered room. Pt seemed to have difficulty following directions. When he was ready to sit on commode, he loss balance and went toward wall. Pt was steadied and when asked what happened, he stated he got distracted. Pt assisted to sitting position and stayed with pt until ready to get up. I assisted to sitting on side of bed for vitals. We then walked 350 ft on RA with gait belt use, rolling walker and asst x 1. I put his slippers on as he told me he could walk better with them. Pt needed cues to stay close to walker and where to walk. Easily distracted. Unsafe. To bed with bed alarm after walk. Getting PT consult to evaluate for home needs as hard to determine what help he is going to have at home. He stated his wife broke her wrist recently and had surgery and daughter lives in [place]. Pt seems cognitively worse today so education not attempted. Pt did remember that brilinta will be twice a day and did not seem to remember anything else we discussed. Stated he does not have scales for daily weights with low EF. Will follow up tomorrow.   Graylon Good, RN BSN  07/30/2018 10:12 AM

## 2018-07-30 NOTE — Progress Notes (Signed)
Patient complained feeling like he can't catch his breath. Given diet coke and reviewed side effect of shortness of breath related to Brilinta and use of coke to decrease symptoms.

## 2018-07-30 NOTE — Care Management (Signed)
Patient provided with Brilinta card. Left at Bedside

## 2018-07-30 NOTE — Progress Notes (Signed)
Patient alert and oriented x 4. Is forgetful about specific information for example patient asked when he had his last bowel movement and answered, "I don't know, I think I have dementia." Aware of who he is, that he is in the hospital and wanting to know when he can go home. Not really confused, more forgetful.

## 2018-07-30 NOTE — Progress Notes (Addendum)
Spoke with patients daughter, She is down from [place] to help coordinate care for patient and her mother (patients wife) Patients wife broke her wrist a day before the patient had a heart attack. She will be unable to care for him when he is discharged. Patient has been evaluated by PT and is unsteady using walker when I got him up to the bathroom this am. See PT note. Will put in a consult to social work to speak to patient and family regarding SNF. Please follow up with this on 6/26 and evaluate needs at that time. Patient was also seen by provider a week before his heart attack regarding possible diagnosis of dementia and parkinsons but has not been worked up for these diagnosis at this time. Thank you

## 2018-07-30 NOTE — Progress Notes (Signed)
Progress Note  Patient Name: Austin Mora Date of Encounter: 07/30/2018  Primary Cardiologist: No primary care provider on file.   Subjective   Seems forgetful this morning. Wants to go home soon.   Inpatient Medications    Scheduled Meds: . aspirin  81 mg Oral Daily  . Chlorhexidine Gluconate Cloth  6 each Topical Daily  . rosuvastatin  40 mg Oral q1800  . sodium chloride flush  3 mL Intravenous Q12H  . ticagrelor  90 mg Oral BID   Continuous Infusions: . sodium chloride 75 mL/hr at 07/29/18 0600  . sodium chloride     PRN Meds: sodium chloride, acetaminophen, sodium chloride flush, zolpidem   Vital Signs    Vitals:   07/29/18 1640 07/29/18 1738 07/29/18 1945 07/30/18 0623  BP:  (!) 139/53 101/88 125/63  Pulse:  72 72 70  Resp:  16 20 18   Temp: 97.9 F (36.6 C) 97.7 F (36.5 C) 98.6 F (37 C) (!) 97.5 F (36.4 C)  TempSrc: Oral Oral Oral Oral  SpO2:  96% 97% 99%  Weight:  82.1 kg    Height:  5\' 9"  (1.753 m)      Intake/Output Summary (Last 24 hours) at 07/30/2018 0819 Last data filed at 07/30/2018 0442 Gross per 24 hour  Intake 120 ml  Output 1075 ml  Net -955 ml   Last 3 Weights 07/29/2018 07/28/2018 07/16/2018  Weight (lbs) 181 lb 168 lb 3.4 oz 171 lb  Weight (kg) 82.101 kg 76.3 kg 77.565 kg      Telemetry    SR - Personally Reviewed  ECG    No tracing this morning - Personally Reviewed  Physical Exam  Older WM GEN: No acute distress.   Neck: No JVD Cardiac: RRR, no murmurs, rubs, or gallops.  Respiratory: Clear to auscultation bilaterally. GI: Soft, nontender, non-distended  MS: No edema; No deformity. Neuro:  Nonfocal  Psych: Normal affect   Labs    High Sensitivity Troponin:   Recent Labs  Lab 07/28/18 1546 07/28/18 1815 07/28/18 2046  TROPONINIHS 513* 860* 1,608*      Cardiac EnzymesNo results for input(s): TROPONINI in the last 168 hours. No results for input(s): TROPIPOC in the last 168 hours.   Chemistry Recent Labs   Lab 07/28/18 1546 07/28/18 1551 07/28/18 1552 07/29/18 0251  NA 139 141  --  137  K 3.8 3.8  --  3.6  CL 104  --   --  104  CO2 22  --   --  21*  GLUCOSE 108*  --   --  73  BUN 14  --   --  10  CREATININE 1.24  --  0.80 1.02  CALCIUM 9.4  --   --  9.1  PROT 6.7  --   --   --   ALBUMIN 4.3  --   --   --   AST 32  --   --   --   ALT 26  --   --   --   ALKPHOS 65  --   --   --   BILITOT 2.0*  --   --   --   GFRNONAA 57*  --   --  >60  GFRAA >60  --   --  >60  ANIONGAP 13  --   --  12     Hematology Recent Labs  Lab 07/28/18 1546 07/28/18 1551 07/29/18 0251  WBC 7.4  --  8.8  RBC  5.61  --  5.38  HGB 16.5 17.0 15.9  HCT 49.3 50.0 47.0  MCV 87.9  --  87.4  MCH 29.4  --  29.6  MCHC 33.5  --  33.8  RDW 12.5  --  12.4  PLT 131*  --  132*    BNPNo results for input(s): BNP, PROBNP in the last 168 hours.   DDimer No results for input(s): DDIMER in the last 168 hours.   Radiology    No results found.  Cardiac Studies   Cath: 07/28/18   Ost LAD lesion is 20% stenosed.  2nd LPL lesion is 80% stenosed.  LPDA lesion is 80% stenosed.  Dist LAD-1 lesion is 30% stenosed.  Dist LAD-2 lesion is 50% stenosed.  Prox LAD to Mid LAD lesion is 90% stenosed.  Post intervention, there is a 0% residual stenosis.  Prox RCA lesion is 30% stenosed.  A stent was successfully placed.   Acute coronary syndrome with initial ST elevation anteriorly followed by diffuse T wave inversion across the entire precordium and lateral leads secondary to long subtotal stenosis of the proximal to mid LAD in a large wrap around LAD.  There was initial TIMI II flow which deteriorated to TIMI I flow prior to the intervention.   Dominant left circumflex vessel with diffuse mid PDA stenosis just proximal to a bifurcation.  Mild 30% stenosis in the proximal RCA.  Successful PCI to the LAD with ultimate insertion of a 3.0 x 38 mm Resolute Onyx DES stent postdilated to 3.3 mm with a diffuse  90% long stenosis being reduced to 0% and improving to brisk TIMI-3 flow.  There is no change in his 20% ostial LAD stenosis 30% and 50% distal LAD stenoses following intervention.  RECOMMENDATION: DAPT for minimum of 12 months.  We will continue bivalirudin 4 hours post procedure.  Smoking cessation is essential.  Will titrate rosuvastatin to 40 mg.   Echo Doppler study to assess LV systolic and diastolic function.  Diagnostic Dominance: Left  Intervention   TTE: 07/29/18  IMPRESSIONS    1. The left ventricle has moderate-severely reduced systolic function, with an ejection fraction of 30-35%. The cavity size was normal. Left ventricular diastolic Doppler parameters are consistent with impaired relaxation. There is akinesis of the mid  to apical anteroseptal and inferoseptal walls and the true apex. Severe hypokinesis of the mid to apical anterior wall, the apical lateral wall, and the apical inferior wall.  2. The right ventricle has normal systolic function. The cavity was normal. There is no increase in right ventricular wall thickness.  3. No evidence of mitral valve stenosis. No significant mitral regurgitation.  4. The aortic valve is tricuspid. Mild calcification of the aortic valve. No stenosis of the aortic valve.  5. The aortic root is normal in size and structure.  6. Normal IVC size. No complete TR doppler jet so unable to estimate PA systolic pressure.   Patient Profile     73 y.o. male with a history of depression, hyperlipidemia on Crestor who was admitted yesterday with an anterior STEMI.  He had anterolateral T wave inversion.  He was brought emergently to the Cath Lab by Dr. Claiborne Billings and cath radially revealing a a segmental 95% subtotally occluded LAD which was stented.  He had minimal disease otherwise.    Assessment & Plan    1. STEMI: underwent successful PCI/DES to the pLAD. Plan for DAPT with ASA/Brilinta for at least one year. hsT peaked at 1608. Now  on high  dose statin. Worked with CR yesterday yesterday and seemed weak. Will order eval PT today  2. ICM: EF noted at 30-35% with akinesis in the mid to apical anteroseptal wall and inferoseptal wall. Severe hypokinesis of the mid to apical/lateral wall. No signs of volume overload on exam.  -- will add low dose coreg today. Consider ACEi/ARB tomorrow if pressure tolerates  3. HL: on high dose statin, LDL 28  Dispo: Patient is not really confused but forgetful and deconditioned. Have ordered PT eval today as he lives at home with his wife. Also CR to educate regarding HF. Added coreg, will consider ACEi/ARB tomorrow.   For questions or updates, please contact [place] Please consult [email or web address] for contact info under        Signed, Reino Bellis, NP  07/30/2018, 8:19 AM

## 2018-07-30 NOTE — Care Management Important Message (Signed)
Important Message  Patient Details  Name: Austin Mora MRN: [id number] Date of Birth: 1946-01-19   Medicare Important Message Given:  Yes     Shelda Altes 07/30/2018, 1:34 PM

## 2018-07-30 NOTE — Progress Notes (Addendum)
Physical Therapy Treatment Patient Details Name: Austin Mora MRN: [id number] DOB: January 19, 1946 Today's Date: 07/30/2018    History of Present Illness Pt is a 73 y/o male admitted secondary to chest pain. Found to have STEMI and s/p heart cath with stent placement. PMH includes DM, HTN, GERD, and memory disorder.     PT Comments    Pt getting up from chair without assist and RN/NT not available, so PT assisted with mobility to bathroom and back to bed. Pt with very poor safety awareness and trying to scoot off end of recliner. Pt also with increased difficulty navigating RW this afternoon and attempted to leave RW in bathroom and at sink. Pt requiring min to mod A for mobility tasks with RW.  Feel pt will require increased assist at d/c, and wife will not be able to provide. Will continue to follow acutely to maximize functional mobility independence and safety.   Follow Up Recommendations  SNF;Supervision/Assistance - 24 hour     Equipment Recommendations  Rolling walker with 5" wheels    Recommendations for Other Services       Precautions / Restrictions Precautions Precautions: Fall Restrictions Weight Bearing Restrictions: No    Mobility  Bed Mobility Overal bed mobility: Needs Assistance Bed Mobility: Sit to Supine       Sit to supine: Min assist   General bed mobility comments: Min A for LE assist.   Transfers Overall transfer level: Needs assistance Equipment used: Rolling walker (2 wheeled) Transfers: Sit to/from Stand Sit to Stand: Mod assist         General transfer comment: Mod A for lift assist and steadying to stand. Pt initially with posterior lean. Required cues for safe hand placement.   Ambulation/Gait Ambulation/Gait assistance: Min assist Gait Distance (Feet): 15 Feet Assistive device: Rolling walker (2 wheeled) Gait Pattern/deviations: Shuffle;Decreased stride length;Trunk flexed Gait velocity: Decreased    General Gait Details: Pt with  increased unsteadiness this session and increased difficulty navigating RW. Required multiple cues not to leave RW behind after toileting and washing hands. Cues for upright posture as well.    Stairs             Wheelchair Mobility    Modified Rankin (Stroke Patients Only)       Balance Overall balance assessment: Needs assistance Sitting-balance support: No upper extremity supported;Feet supported Sitting balance-Leahy Scale: Fair     Standing balance support: Bilateral upper extremity supported;During functional activity Standing balance-Leahy Scale: Poor Standing balance comment: Reliant on BUE support                             Cognition Arousal/Alertness: Awake/alert Behavior During Therapy: Flat affect Overall Cognitive Status: No family/caregiver present to determine baseline cognitive functioning                                 General Comments: Pt getting up from chair without assist upon arrival. Pt also with more difficulty sequencing this session.       Exercises      General Comments General comments (skin integrity, edema, etc.): Pt requiring step by step cues at sink to wash hands.       Pertinent Vitals/Pain Pain Assessment: No/denies pain    Home Living  Prior Function            PT Goals (current goals can now be found in the care plan section) Acute Rehab PT Goals Patient Stated Goal: "to go to the bathroom"  PT Goal Formulation: With patient Time For Goal Achievement: 08/13/18 Potential to Achieve Goals: Good Progress towards PT goals: Progressing toward goals    Frequency    Min 2X/week      PT Plan Current plan remains appropriate    Co-evaluation              AM-PAC PT "6 Clicks" Mobility   Outcome Measure  Help needed turning from your back to your side while in a flat bed without using bedrails?: A Little Help needed moving from lying on your back to  sitting on the side of a flat bed without using bedrails?: A Little Help needed moving to and from a bed to a chair (including a wheelchair)?: A Little Help needed standing up from a chair using your arms (e.g., wheelchair or bedside chair)?: A Lot Help needed to walk in hospital room?: A Little Help needed climbing 3-5 steps with a railing? : A Lot 6 Click Score: 16    End of Session Equipment Utilized During Treatment: Gait belt Activity Tolerance: Patient tolerated treatment well Patient left: in bed;with call bell/phone within reach;with bed alarm set Nurse Communication: Mobility status PT Visit Diagnosis: Unsteadiness on feet (R26.81);Muscle weakness (generalized) (M62.81);Difficulty in walking, not elsewhere classified (R26.2)     Time: 9163-8466 PT Time Calculation (min) (ACUTE ONLY): 21 min  Charges:  $Gait Training: 8-22 mins                     Leighton Ruff, PT, DPT  Acute Rehabilitation Services  Pager: [phone] Office: [phone]    Rudean Hitt 07/30/2018, 6:36 PM

## 2018-07-31 DIAGNOSIS — I255 Ischemic cardiomyopathy: Secondary | ICD-10-CM

## 2018-07-31 MED ORDER — LOSARTAN POTASSIUM 25 MG PO TABS
12.5000 mg | ORAL_TABLET | Freq: Every day | ORAL | Status: DC
  Administered 2018-07-31 – 2018-08-05 (×6): 12.5 mg via ORAL
  Filled 2018-07-31 (×6): qty 1

## 2018-07-31 NOTE — TOC Initial Note (Signed)
Transition of Care [facility]) - Initial/Assessment Note    Patient Details  Name: Austin Mora MRN: [id number] Date of Birth: 1945-03-25  [facility] CM/SW Contact:    Bethena Roys, RN Phone Number: 07/31/2018, 1:23 PM  Clinical Narrative:  Pt presented for [facility]- post cath. PTA from home with spouse. Patient's daughter is home now with wife since patient is hospitalized. CM did offer Medicare SNF approved list- Patient consulted by CIR as well. FL2 completed-not faxed out and CM will continue to monitor for additional transition of care needs.                  Expected Discharge Plan: IP Rehab Facility(FL2 completed- in case needs to fax out to SNF) Barriers to Discharge: Continued Medical Work up   Patient Goals and CMS Choice   CMS Medicare.gov Compare Post Acute Care list provided to:: Patient Choice offered to / list presented to : Patient  Expected Discharge Plan and Services Expected Discharge Plan: IP Rehab Facility(FL2 completed- in case needs to fax out to SNF) In-house Referral: (CIR) Discharge Planning Services: CM Consult                                          Prior Living Arrangements/Services   Lives with:: Spouse Patient language and need for interpreter reviewed:: Yes        Need for Family Participation in Patient Care: Yes (Comment) Care giver support system in place?: Yes (comment) Current home services: (none) Criminal Activity/Legal Involvement Pertinent to Current Situation/Hospitalization: No - Comment as needed  Activities of Daily Living Home Assistive Devices/Equipment: None ADL Screening (condition at time of admission) Patient's cognitive ability adequate to safely complete daily activities?: Yes Is the patient deaf or have difficulty hearing?: No Does the patient have difficulty seeing, even when wearing glasses/contacts?: No Does the patient have difficulty concentrating, remembering, or making  decisions?: Yes Patient able to express need for assistance with ADLs?: Yes Does the patient have difficulty dressing or bathing?: No Independently performs ADLs?: Yes (appropriate for developmental age) Does the patient have difficulty walking or climbing stairs?: No Weakness of Legs: None Weakness of Arms/Hands: None  Permission Sought/Granted Permission sought to share information with : Family Supports, Chartered certified accountant granted to share information with : Yes, Verbal Permission Granted              Emotional Assessment Appearance:: Appears stated age Attitude/Demeanor/Rapport: Engaged, Gracious Affect (typically observed): Accepting Orientation: : Oriented to Self, Oriented to Place, Oriented to  Time, Oriented to Situation Alcohol / Substance Use: Not Applicable Psych Involvement: No (comment)  Admission diagnosis:  Acute ST elevation myocardial infarction (STEMI) involving left anterior descending (LAD) coronary artery (HCC) [I21.02] Patient Active Problem List   Diagnosis Date Noted  . Acute ST elevation myocardial infarction (STEMI) involving left anterior descending (LAD) coronary artery ([place]) 07/28/2018  . ST elevation myocardial infarction involving left anterior descending (LAD) coronary artery ([place])   . 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 Parkinson's disease ([place]) 07/16/2018  . Memory disorder 07/01/2018   PCP:  Leanna Battles, MD Pharmacy:   [facility], [place] [address] Phone: [phone] Fax: [phone]  [place] ([place]), [place] [address] [place] (  [place]) [place] [address] Phone: [phone] Fax: [phone]     Social Determinants of Health (SDOH) Interventions    Readmission Risk Interventions No flowsheet data found.

## 2018-07-31 NOTE — Progress Notes (Signed)
CARDIAC REHAB PHASE I   PRE:  Rate/Rhythm: 63 SR  BP:  Supine: 114/63  Sitting:   Standing:    SaO2: 96%RA  MODE:  Ambulation: 180 ft   POST:  Rate/Rhythm: 80 SR  BP:  Supine: 125/68  Sitting:   Standing:    SaO2: 95%RA 0850-1000 Pt walked 180 ft on RA with gait belt use, rolling walker and asst x 1. Encouraged to stand upright, stay close to walker. Gets very close to the walls. Assisted back to bed with alarm on . Set up breakfast for pt and cut up sausage, assisted. Pt encouraged to take his meds again discussing importance of brilinta. Encouraged him to watch his salt with low pumping. Does not have scales to weigh. Did not give ex ed due to mobility issues. Encouraged him to be up with asst only due to fall risk. Pt requested that I make a packet of all the ed materials and notes that I gave him to get copies to his daughter. This was done and given to pt. Pt did not seem to understand reasoning for going to Rehab. Discussed that he is weak and we do not want him to fall and he would benefit from some more therapy. PT following. Cardiologist would like pt to be considered for CIR.   Graylon Good, RN BSN  07/31/2018 9:56 AM

## 2018-07-31 NOTE — Progress Notes (Addendum)
Progress Note  Patient Name: Austin Mora Date of Encounter: 07/31/2018  Primary Cardiologist:New to Dr. Claiborne Billings   Subjective   No recurrent chest pain. PT recommended SNF/24 hours supervision.   Inpatient Medications    Scheduled Meds: . aspirin  81 mg Oral Daily  . carvedilol  3.125 mg Oral BID WC  . rosuvastatin  40 mg Oral q1800  . sodium chloride flush  3 mL Intravenous Q12H  . ticagrelor  90 mg Oral BID   Continuous Infusions: . sodium chloride 75 mL/hr at 07/29/18 0600  . sodium chloride     PRN Meds: sodium chloride, acetaminophen, sodium chloride flush, zolpidem   Vital Signs    Vitals:   07/30/18 1000 07/30/18 2000 07/31/18 0649 07/31/18 0816  BP: 129/79 117/67 (!) 129/91   Pulse:  67 66 65  Resp:  18 18   Temp:  98 F (36.7 C) 97.6 F (36.4 C)   TempSrc:  Oral    SpO2:  97% 96%   Weight:   75.5 kg   Height:        Intake/Output Summary (Last 24 hours) at 07/31/2018 0832 Last data filed at 07/30/2018 2151 Gross per 24 hour  Intake 243 ml  Output -  Net 243 ml   Last 3 Weights 07/31/2018 07/29/2018 07/28/2018  Weight (lbs) 166 lb 8 oz 181 lb 168 lb 3.4 oz  Weight (kg) 75.524 kg 82.101 kg 76.3 kg      Telemetry    Sinus rhythm at rate of 70s with PACs - Personally Reviewed  ECG    No new tracing  Physical Exam   GEN: Elderly male in no acute distress.   Neck: No JVD Cardiac: RRR, no murmurs, rubs, or gallops.  Respiratory: Clear to auscultation bilaterally. GI: Soft, nontender, non-distended  MS: No edema; No deformity. Neuro:  Nonfocal  Psych: Normal affect   Labs    High Sensitivity Troponin:   Recent Labs  Lab 07/28/18 1546 07/28/18 1815 07/28/18 2046  TROPONINIHS 513* 860* 1,608*     Chemistry Recent Labs  Lab 07/28/18 1546 07/28/18 1551 07/28/18 1552 07/29/18 0251  NA 139 141  --  137  K 3.8 3.8  --  3.6  CL 104  --   --  104  CO2 22  --   --  21*  GLUCOSE 108*  --   --  73  BUN 14  --   --  10  CREATININE  1.24  --  0.80 1.02  CALCIUM 9.4  --   --  9.1  PROT 6.7  --   --   --   ALBUMIN 4.3  --   --   --   AST 32  --   --   --   ALT 26  --   --   --   ALKPHOS 65  --   --   --   BILITOT 2.0*  --   --   --   GFRNONAA 57*  --   --  >60  GFRAA >60  --   --  >60  ANIONGAP 13  --   --  12     Hematology Recent Labs  Lab 07/28/18 1546 07/28/18 1551 07/29/18 0251  WBC 7.4  --  8.8  RBC 5.61  --  5.38  HGB 16.5 17.0 15.9  HCT 49.3 50.0 47.0  MCV 87.9  --  87.4  MCH 29.4  --  29.6  MCHC 33.5  --  33.8  RDW 12.5  --  12.4  PLT 131*  --  132*    Radiology    No results found.  Cardiac Studies   Cath: 07/28/18   Ost LAD lesion is 20% stenosed.  2nd LPL lesion is 80% stenosed.  LPDA lesion is 80% stenosed.  Dist LAD-1 lesion is 30% stenosed.  Dist LAD-2 lesion is 50% stenosed.  Prox LAD to Mid LAD lesion is 90% stenosed.  Post intervention, there is a 0% residual stenosis.  Prox RCA lesion is 30% stenosed.  A stent was successfully placed.  Acute coronary syndrome with initial ST elevation anteriorly followed by diffuse T wave inversion across the entire precordium and lateral leads secondary to long subtotal stenosis of the proximal to mid LAD in a large wrap around LAD. There was initial TIMI II flow which deteriorated to TIMI I flow prior to the intervention.   Dominant left circumflex vessel with diffuse mid PDA stenosis just proximal to a bifurcation.  Mild 30% stenosis in the proximal RCA.  Successful PCI to the LAD with ultimate insertion of a 3.0 x 38 mm Resolute Onyx DES stent postdilated to 3.3 mm with a diffuse 90% long stenosis being reduced to 0% and improving to brisk TIMI-3 flow. There is no change in his 20% ostial LAD stenosis 30% and 50% distal LAD stenoses following intervention.  RECOMMENDATION: DAPT for minimum of 12 months. We will continue bivalirudin 4 hours post procedure. Smoking cessation is essential. Will titrate rosuvastatin to  40 mg. Echo Doppler study to assess LV systolic and diastolic function.  TTE: 07/29/18  IMPRESSIONS   1. The left ventricle has moderate-severely reduced systolic function, with an ejection fraction of 30-35%. The cavity size was normal. Left ventricular diastolic Doppler parameters are consistent with impaired relaxation. There is akinesis of the mid  to apical anteroseptal and inferoseptal walls and the true apex. Severe hypokinesis of the mid to apical anterior wall, the apical lateral wall, and the apical inferior wall. 2. The right ventricle has normal systolic function. The cavity was normal. There is no increase in right ventricular wall thickness. 3. No evidence of mitral valve stenosis. No significant mitral regurgitation. 4. The aortic valve is tricuspid. Mild calcification of the aortic valve. No stenosis of the aortic valve. 5. The aortic root is normal in size and structure. 6. Normal IVC size. No complete TR doppler jet so unable to estimate PA systolic pressure.  Patient Profile     73 y.o. male with a history of depression, hyperlipidemia on Crestor who was admitted yesterday with an anterior STEMI. He had anterolateral T wave inversion. He was brought emergently to the Cath Lab by Dr. Claiborne Billings and cath radially revealing a a segmental 95% subtotally occluded LAD which was stented. He had minimal disease otherwise.   Assessment & Plan    1. STEMI: underwent successful PCI/DES to the pLAD. Plan for DAPT with ASA/Brilinta for at least one year. hsT peaked at 1608. No recurrent chest pain. Continue statin and BB.   2. ICM: EF noted at 30-35% with akinesis in the mid to apical anteroseptal wall and inferoseptal wall. Severe hypokinesis of the mid to apical/lateral wall. Euvolemic. Added  low dose coreg yesterday. BP stable. Will add low dose ARB today>> transition to [facility] as outpatient  3. HL: on high dose statin, LDL 28  4. Deconditioning:  PT recommended  SNF/24 hours assistance. Patient's wife broke her wrist a day before the patient had a heart  attack. SM and SW on board.   For questions or updates, please contact [place] Please consult [email or web address] for contact info under        Signed, Leanor Kail, PA  07/31/2018, 8:32 AM    Agree with note by Robbie Lis PA-C  Postop day #3 anterior STEMI treated with proximal LAD PCI and drug-eluting stenting.  He does have severe LV dysfunction.  He is remained stable.  He denies chest pain.  He does have some cognitive dysfunction dysfunction and is somewhat unsteady on his feet.  His exam is benign.  I favor inpatient rehab as opposed to outpatient.  We will pursue this.  Agree with starting Coreg and an ARB with intent to transition to [facility] as an outpatient.  Lorretta Harp, M.D., [place], [facility], [place] [address] [place], [place]  [address]  [phone] 07/31/2018 10:05 AM

## 2018-07-31 NOTE — Progress Notes (Signed)
Inpatient Rehab Admissions:  Inpatient Rehab Consult received.  I met with patient at the bedside for rehabilitation assessment and to discuss goals and expectations of an inpatient rehab admission.  He is intermittently confused, asks that I speak with his daughter regarding decision for CIR. I was able to speak with Amy, at length, regarding rehab needs, and likely needs for 24/7 supervision at discharge from any rehab facility (ours vs. SNF).  She gave me permission to open a case with his insurance company for prior authorization for CIR, and will work over the weekend to determine if she can pull together 24/7 supervision/care for her parents at discharge and we will follow up on Monday.   Signed: Shann Medal, PT, DPT Admissions Coordinator [phone] 07/31/18  12:02 PM

## 2018-07-31 NOTE — [place] FL2 (Signed)
[place] MEDICAID FL2 LEVEL OF CARE SCREENING TOOL     IDENTIFICATION  Patient Name: Austin Mora Birthdate: Oct 25, 1945 Sex: male Admission Date (Current Location): 07/28/2018  [facility] and [place] Number:  Herbalist and Address:  [facility]. [facility], [place] [address] [place], [place] [address]      Provider Number: [id number]  Attending Physician Name and Address:  Troy Sine, MD  Relative Name and Phone Number:       Current Level of Care: Hospital Recommended Level of Care: [place] Prior Approval Number:    Date Approved/Denied:   PASRR Number: [id number]  Discharge Plan: SNF    Current Diagnoses: Patient Active Problem List   Diagnosis Date Noted  . Acute ST elevation myocardial infarction (STEMI) involving left anterior descending (LAD) coronary artery ([place]) 07/28/2018  . ST elevation myocardial infarction involving left anterior descending (LAD) coronary artery ([place])   . 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 Parkinson's disease ([place]) 07/16/2018  . Memory disorder 07/01/2018    Orientation RESPIRATION BLADDER Height & Weight     Self, Time, Situation, Place  Normal Continent Weight: 75.5 kg Height:  5\' 9"  (175.3 cm)  BEHAVIORAL SYMPTOMS/MOOD NEUROLOGICAL BOWEL NUTRITION STATUS  (N/A)   Continent Diet(Regular Diet-Thin Liquids.)  AMBULATORY STATUS COMMUNICATION OF NEEDS Skin   Limited Assist(Assist x 1 and gait belt) Verbally Normal                       Personal Care Assistance Level of Assistance  Bathing, Feeding, Dressing Bathing Assistance: Maximum assistance Feeding assistance: Limited assistance Dressing Assistance: Maximum assistance     Functional Limitations Info  Sight, Hearing, Speech Sight Info: Adequate Hearing Info: Adequate Speech Info: Adequate    SPECIAL CARE FACTORS FREQUENCY  PT (By licensed PT), OT (By licensed OT)     PT Frequency: 5 x week OT  Frequency: 5 x week            Contractures Contractures Info: Not present    Additional Factors Info  Allergies, Code Status, Psychotropic Code Status Info: Full Code Allergies Info: Sulfa, Aricept Psychotropic Info: Lexapro 10 mg tablet daily Insulin Sliding Scale Info: (N/A)       Current Medications (07/31/2018):  This is the current hospital active medication list Current Facility-Administered Medications  Medication Dose Route Frequency Provider Last Rate Last Dose  . 0.9 %  sodium chloride infusion   Intravenous Continuous Troy Sine, MD 75 mL/hr at 07/29/18 0600    . 0.9 %  sodium chloride infusion  250 mL Intravenous PRN Troy Sine, MD      . acetaminophen (TYLENOL) tablet 650 mg  650 mg Oral Q4H PRN Troy Sine, MD      . aspirin chewable tablet 81 mg  81 mg Oral Daily Troy Sine, MD   81 mg at 07/31/18 0816  . carvedilol (COREG) tablet 3.125 mg  3.125 mg Oral BID WC Reino Bellis B, NP   3.125 mg at 07/31/18 0816  . losartan (COZAAR) tablet 12.5 mg  12.5 mg Oral Daily Bhagat, Bhavinkumar, PA   12.5 mg at 07/31/18 1120  . rosuvastatin (CRESTOR) tablet 40 mg  40 mg Oral q1800 Troy Sine, MD   40 mg at 07/30/18 1933  . sodium chloride flush (NS) 0.9 % injection 3 mL  3 mL Intravenous Q12H Troy Sine, MD   3 mL at 07/31/18 1141  .  sodium chloride flush (NS) 0.9 % injection 3 mL  3 mL Intravenous PRN Troy Sine, MD      . ticagrelor [facility]) tablet 90 mg  90 mg Oral BID Troy Sine, MD   90 mg at 07/31/18 0816  . zolpidem (AMBIEN) tablet 5 mg  5 mg Oral QHS PRN Troy Sine, MD       Facility-Administered Medications Ordered in Other Encounters  Medication Dose Route Frequency Provider Last Rate Last Dose  . 0.9 %  sodium chloride infusion    Continuous PRN Troy Sine, MD 75 mL/hr at 07/29/18 0003 500 mL at 07/29/18 0003  . ticagrelor (BRILINTA) tablet    PRN Troy Sine, MD   90 mg at 07/29/18 3734     Discharge  Medications: Please see discharge summary for a list of discharge medications.  Relevant Imaging Results:  Relevant Lab Results:   Additional Information [id number]  Bethena Roys, RN

## 2018-08-01 DIAGNOSIS — R5381 Other malaise: Secondary | ICD-10-CM

## 2018-08-01 DIAGNOSIS — E785 Hyperlipidemia, unspecified: Secondary | ICD-10-CM

## 2018-08-01 NOTE — Progress Notes (Signed)
Progress Note  Patient Name: Tracey Harries Date of Encounter: 08/01/2018  Primary Cardiologist: New to Dr. Claiborne Billings   Subjective   He said "I feel a bit discombobulated ".  He denied chest pain shortness of breath.  Physical therapy has recommended inpatient rehabilitation and he will also need 24 7 supervision once he is home.  Inpatient Medications    Scheduled Meds: . aspirin  81 mg Oral Daily  . carvedilol  3.125 mg Oral BID WC  . losartan  12.5 mg Oral Daily  . rosuvastatin  40 mg Oral q1800  . sodium chloride flush  3 mL Intravenous Q12H  . ticagrelor  90 mg Oral BID   Continuous Infusions: . sodium chloride 75 mL/hr at 07/29/18 0600  . sodium chloride     PRN Meds: sodium chloride, acetaminophen, sodium chloride flush, zolpidem   Vital Signs    Vitals:   07/31/18 2025 08/01/18 0500 08/01/18 0635 08/01/18 0851  BP: (!) 126/54  116/60 136/72  Pulse: 64  60 75  Resp: 18  18   Temp: 98 F (36.7 C)  98.2 F (36.8 C)   TempSrc: Oral  Oral   SpO2: 99%  98%   Weight:  75 kg    Height:        Intake/Output Summary (Last 24 hours) at 08/01/2018 1034 Last data filed at 08/01/2018 0655 Gross per 24 hour  Intake 480 ml  Output -  Net 480 ml   Filed Weights   07/29/18 1738 07/31/18 0649 08/01/18 0500  Weight: 82.1 kg 75.5 kg 75 kg    Telemetry    Sinus rhythm with PACs and PVCs- Personally Reviewed  ECG    No new tracings- Personally Reviewed  Physical Exam   GEN: No acute distress.   Neck: No JVD Cardiac: RRR, no murmurs, rubs, or gallops.  Respiratory: Clear to auscultation bilaterally. GI: Soft, nontender, non-distended  MS: No edema; No deformity. Neuro:  Nonfocal  Psych: Normal affect   Labs    Chemistry Recent Labs  Lab 07/28/18 1546 07/28/18 1551 07/28/18 1552 07/29/18 0251  NA 139 141  --  137  K 3.8 3.8  --  3.6  CL 104  --   --  104  CO2 22  --   --  21*  GLUCOSE 108*  --   --  73  BUN 14  --   --  10  CREATININE 1.24  --   0.80 1.02  CALCIUM 9.4  --   --  9.1  PROT 6.7  --   --   --   ALBUMIN 4.3  --   --   --   AST 32  --   --   --   ALT 26  --   --   --   ALKPHOS 65  --   --   --   BILITOT 2.0*  --   --   --   GFRNONAA 57*  --   --  >60  GFRAA >60  --   --  >60  ANIONGAP 13  --   --  12     Hematology Recent Labs  Lab 07/28/18 1546 07/28/18 1551 07/29/18 0251  WBC 7.4  --  8.8  RBC 5.61  --  5.38  HGB 16.5 17.0 15.9  HCT 49.3 50.0 47.0  MCV 87.9  --  87.4  MCH 29.4  --  29.6  MCHC 33.5  --  33.8  RDW  12.5  --  12.4  PLT 131*  --  132*    Cardiac EnzymesNo results for input(s): TROPONINI in the last 168 hours. No results for input(s): TROPIPOC in the last 168 hours.   BNPNo results for input(s): BNP, PROBNP in the last 168 hours.   DDimer No results for input(s): DDIMER in the last 168 hours.   Radiology    No results found.  Cardiac Studies   Cath: 07/28/18   Ost LAD lesion is 20% stenosed.  2nd LPL lesion is 80% stenosed.  LPDA lesion is 80% stenosed.  Dist LAD-1 lesion is 30% stenosed.  Dist LAD-2 lesion is 50% stenosed.  Prox LAD to Mid LAD lesion is 90% stenosed.  Post intervention, there is a 0% residual stenosis.  Prox RCA lesion is 30% stenosed.  A stent was successfully placed.  Acute coronary syndrome with initial ST elevation anteriorly followed by diffuse T wave inversion across the entire precordium and lateral leads secondary to long subtotal stenosis of the proximal to mid LAD in a large wrap around LAD. There was initial TIMI II flow which deteriorated to TIMI I flow prior to the intervention.   Dominant left circumflex vessel with diffuse mid PDA stenosis just proximal to a bifurcation.  Mild 30% stenosis in the proximal RCA.  Successful PCI to the LAD with ultimate insertion of a 3.0 x 38 mm Resolute Onyx DES stent postdilated to 3.3 mm with a diffuse 90% long stenosis being reduced to 0% and improving to brisk TIMI-3 flow. There is no change  in his 20% ostial LAD stenosis 30% and 50% distal LAD stenoses following intervention.  RECOMMENDATION: DAPT for minimum of 12 months. We will continue bivalirudin 4 hours post procedure. Smoking cessation is essential. Will titrate rosuvastatin to 40 mg. Echo Doppler study to assess LV systolic and diastolic function.  TTE: 07/29/18  IMPRESSIONS   1. The left ventricle has moderate-severely reduced systolic function, with an ejection fraction of 30-35%. The cavity size was normal. Left ventricular diastolic Doppler parameters are consistent with impaired relaxation. There is akinesis of the mid  to apical anteroseptal and inferoseptal walls and the true apex. Severe hypokinesis of the mid to apical anterior wall, the apical lateral wall, and the apical inferior wall. 2. The right ventricle has normal systolic function. The cavity was normal. There is no increase in right ventricular wall thickness. 3. No evidence of mitral valve stenosis. No significant mitral regurgitation. 4. The aortic valve is tricuspid. Mild calcification of the aortic valve. No stenosis of the aortic valve. 5. The aortic root is normal in size and structure. 6. Normal IVC size. No complete TR doppler jet so unable to estimate PA systolic pressure.  Patient Profile     73 y.o. male with a history of depression, hyperlipidemia on Crestor who was admitted yesterday with an anterior STEMI. He had anterolateral T wave inversion. Hewasbrought emergently to the Cath Lab by Dr. Claiborne Billings and cath radially revealing a a segmental 95% subtotally occluded LAD which was stented. He had minimal disease otherwise.  Assessment & Plan    1.  STEMI: He underwent successful drug-eluting stent placement to the proximal LAD.  The plan is for dual antiplatelet therapy with aspirin and Brilinta for at least 1 year.  He has not had recurrent chest pain.  Continue carvedilol, losartan, and rosuvastatin.  2.  Ischemic  cardiomyopathy: LVEF 30 to 35%.  He appears euvolemic.  Currently on carvedilol and low-dose losartan.  The  plan is to transition to [facility] in the outpatient setting.  3.  Hyperlipidemia: LDL 28.  He is on rosuvastatin.  4.  Deconditioning: Physical therapy has recommended SNF/24-hour assistance.  Dr. Gwenlyn Found prefers inpatient rehabilitation.  Patient's wife broke her wrist the day before the patient's heart attack.  He is being followed by social work as well.   For questions or updates, please contact [place] Please consult [email or web address] for contact info under Cardiology/STEMI.      Signed, Kate Sable, MD  08/01/2018, 10:34 AM

## 2018-08-01 NOTE — Progress Notes (Signed)
CARDIAC REHAB PHASE I   PRE:  Rate/Rhythm: SR 78  BP:  Supine: 112/67     SaO2: 96% RA  MODE:  Ambulation: 200 ft   POST:  Rate/Rhythm: SR 80  BP:  Supine: 122/77     SaO2: 95% RA  2440-1027 Pt ambulated 287ft using RW with shuffling gait.  Pt did not require cues to stay inside RW as he was able to.  Pt returned to bed with bed alarm on and call bell within reach.  Noel Christmas, RN 08/01/2018 10:32 AM

## 2018-08-02 NOTE — Progress Notes (Signed)
Progress Note  Patient Name: Austin Mora Date of Encounter: 08/02/2018  Primary Cardiologist: New to Dr. Claiborne Billings   Subjective   No complaints this morning.  He wonders how long he will have to remain in the hospital.  Physical therapy has recommended inpatient rehabilitation and he will also need 24/7 supervision once he is home.  Inpatient Medications    Scheduled Meds: . aspirin  81 mg Oral Daily  . carvedilol  3.125 mg Oral BID WC  . losartan  12.5 mg Oral Daily  . rosuvastatin  40 mg Oral q1800  . sodium chloride flush  3 mL Intravenous Q12H  . ticagrelor  90 mg Oral BID   Continuous Infusions: . sodium chloride 75 mL/hr at 07/29/18 0600  . sodium chloride     PRN Meds: sodium chloride, acetaminophen, sodium chloride flush, zolpidem   Vital Signs    Vitals:   08/01/18 1708 08/01/18 1749 08/02/18 0641 08/02/18 0722  BP: 109/65 (!) 190/113 130/63 113/72  Pulse: 81 87 62   Resp:   18   Temp:   (!) 97.5 F (36.4 C)   TempSrc:   Oral   SpO2:  98% 99%   Weight:   73.9 kg   Height:        Intake/Output Summary (Last 24 hours) at 08/02/2018 0919 Last data filed at 08/02/2018 0557 Gross per 24 hour  Intake 240 ml  Output -  Net 240 ml   Filed Weights   07/31/18 0649 08/01/18 0500 08/02/18 0641  Weight: 75.5 kg 75 kg 73.9 kg    Telemetry    Sinus rhythm with PACs- Personally Reviewed  Physical Exam   GEN: No acute distress.   Neck: No JVD Cardiac: RRR, no murmurs, rubs, or gallops.  Respiratory: Clear to auscultation bilaterally. GI: Soft, nontender, non-distended  MS: No edema; No deformity. Neuro:  Nonfocal  Psych: Normal affect   Labs    Chemistry Recent Labs  Lab 07/28/18 1546 07/28/18 1551 07/28/18 1552 07/29/18 0251  NA 139 141  --  137  K 3.8 3.8  --  3.6  CL 104  --   --  104  CO2 22  --   --  21*  GLUCOSE 108*  --   --  73  BUN 14  --   --  10  CREATININE 1.24  --  0.80 1.02  CALCIUM 9.4  --   --  9.1  PROT 6.7  --   --    --   ALBUMIN 4.3  --   --   --   AST 32  --   --   --   ALT 26  --   --   --   ALKPHOS 65  --   --   --   BILITOT 2.0*  --   --   --   GFRNONAA 57*  --   --  >60  GFRAA >60  --   --  >60  ANIONGAP 13  --   --  12     Hematology Recent Labs  Lab 07/28/18 1546 07/28/18 1551 07/29/18 0251  WBC 7.4  --  8.8  RBC 5.61  --  5.38  HGB 16.5 17.0 15.9  HCT 49.3 50.0 47.0  MCV 87.9  --  87.4  MCH 29.4  --  29.6  MCHC 33.5  --  33.8  RDW 12.5  --  12.4  PLT 131*  --  132*  Cardiac EnzymesNo results for input(s): TROPONINI in the last 168 hours. No results for input(s): TROPIPOC in the last 168 hours.   BNPNo results for input(s): BNP, PROBNP in the last 168 hours.   DDimer No results for input(s): DDIMER in the last 168 hours.   Radiology    No results found.  Cardiac Studies   Cath: 07/28/18   Ost LAD lesion is 20% stenosed.  2nd LPL lesion is 80% stenosed.  LPDA lesion is 80% stenosed.  Dist LAD-1 lesion is 30% stenosed.  Dist LAD-2 lesion is 50% stenosed.  Prox LAD to Mid LAD lesion is 90% stenosed.  Post intervention, there is a 0% residual stenosis.  Prox RCA lesion is 30% stenosed.  A stent was successfully placed.  Acute coronary syndrome with initial ST elevation anteriorly followed by diffuse T wave inversion across the entire precordium and lateral leads secondary to long subtotal stenosis of the proximal to mid LAD in a large wrap around LAD. There was initial TIMI II flow which deteriorated to TIMI I flow prior to the intervention.   Dominant left circumflex vessel with diffuse mid PDA stenosis just proximal to a bifurcation.  Mild 30% stenosis in the proximal RCA.  Successful PCI to the LAD with ultimate insertion of a 3.0 x 38 mm Resolute Onyx DES stent postdilated to 3.3 mm with a diffuse 90% long stenosis being reduced to 0% and improving to brisk TIMI-3 flow. There is no change in his 20% ostial LAD stenosis 30% and 50% distal LAD  stenoses following intervention.  RECOMMENDATION: DAPT for minimum of 12 months. We will continue bivalirudin 4 hours post procedure. Smoking cessation is essential. Will titrate rosuvastatin to 40 mg. Echo Doppler study to assess LV systolic and diastolic function.  TTE: 07/29/18  IMPRESSIONS   1. The left ventricle has moderate-severely reduced systolic function, with an ejection fraction of 30-35%. The cavity size was normal. Left ventricular diastolic Doppler parameters are consistent with impaired relaxation. There is akinesis of the mid  to apical anteroseptal and inferoseptal walls and the true apex. Severe hypokinesis of the mid to apical anterior wall, the apical lateral wall, and the apical inferior wall. 2. The right ventricle has normal systolic function. The cavity was normal. There is no increase in right ventricular wall thickness. 3. No evidence of mitral valve stenosis. No significant mitral regurgitation. 4. The aortic valve is tricuspid. Mild calcification of the aortic valve. No stenosis of the aortic valve. 5. The aortic root is normal in size and structure. 6. Normal IVC size. No complete TR doppler jet so unable to estimate PA systolic pressure.  Patient Profile     73 y.o. male with a history of depression, hyperlipidemia on Crestor who was admitted yesterday with an anterior STEMI. He had anterolateral T wave inversion. Hewasbrought emergently to the Cath Lab by Dr. Claiborne Billings and cath radially revealing a a segmental 95% subtotally occluded LAD which was stented. He had minimal disease otherwise.  Assessment & Plan    1.  STEMI: He underwent successful drug-eluting stent placement to the proximal LAD.  The plan is for dual antiplatelet therapy with aspirin and Brilinta for at least 1 year.  He has not had recurrent chest pain.  Continue carvedilol, losartan, and rosuvastatin.  2.  Ischemic cardiomyopathy: LVEF 30 to 35%.  He appears euvolemic.   Currently on carvedilol and low-dose losartan.  The plan is to transition to [facility] in the outpatient setting.  3.  Hyperlipidemia: LDL  28.  He is on rosuvastatin.  4.  Deconditioning: Physical therapy has recommended SNF/24-hour assistance.  Dr. Gwenlyn Found prefers inpatient rehabilitation.  Patient's wife broke her wrist the day before the patient's heart attack.  He is being followed by social work as well.   For questions or updates, please contact [place] Please consult [email or web address] for contact info under Cardiology/STEMI.      Signed, Kate Sable, MD  08/02/2018, 9:19 AM

## 2018-08-03 LAB — BASIC METABOLIC PANEL
Anion gap: 13 (ref 5–15)
BUN: 13 mg/dL (ref 8–23)
CO2: 20 mmol/L — ABNORMAL LOW (ref 22–32)
Calcium: 9.7 mg/dL (ref 8.9–10.3)
Chloride: 105 mmol/L (ref 98–111)
Creatinine, Ser: 1.02 mg/dL (ref 0.61–1.24)
GFR calc Af Amer: >60 mL/min (ref >60)
GFR calc non Af Amer: >60 mL/min (ref >60)
Glucose, Bld: 93 mg/dL (ref 70–99)
Potassium: 3.9 mmol/L (ref 3.5–5.1)
Sodium: 138 mmol/L (ref 135–145)

## 2018-08-03 MED ORDER — SPIRONOLACTONE 12.5 MG HALF TABLET
12.5000 mg | ORAL_TABLET | Freq: Every day | ORAL | Status: DC
  Administered 2018-08-03 – 2018-08-05 (×3): 12.5 mg via ORAL
  Filled 2018-08-03 (×3): qty 1

## 2018-08-03 NOTE — Progress Notes (Addendum)
Progress Note  Patient Name: Austin Mora Date of Encounter: 08/03/2018  Primary Cardiologist: Shelva Majestic, MD   Subjective   Lying in bed. No complaints.   Inpatient Medications    Scheduled Meds: . aspirin  81 mg Oral Daily  . carvedilol  3.125 mg Oral BID WC  . losartan  12.5 mg Oral Daily  . rosuvastatin  40 mg Oral q1800  . sodium chloride flush  3 mL Intravenous Q12H  . ticagrelor  90 mg Oral BID   Continuous Infusions: . sodium chloride 75 mL/hr at 07/29/18 0600  . sodium chloride     PRN Meds: sodium chloride, acetaminophen, sodium chloride flush, zolpidem   Vital Signs    Vitals:   08/02/18 1346 08/02/18 2026 08/03/18 0531 08/03/18 0536  BP: (!) 102/52 109/63  134/78  Pulse: 60 64  61  Resp:      Temp: 97.9 F (36.6 C) 99.1 F (37.3 C)  98.6 F (37 C)  TempSrc:  Oral  Oral  SpO2: 100% 99%  98%  Weight:   74 kg   Height:        Intake/Output Summary (Last 24 hours) at 08/03/2018 0917 Last data filed at 08/03/2018 0532 Gross per 24 hour  Intake 480 ml  Output 200 ml  Net 280 ml   Last 3 Weights 08/03/2018 08/02/2018 08/01/2018  Weight (lbs) 163 lb 3.2 oz 163 lb 165 lb 5.5 oz  Weight (kg) 74.027 kg 73.936 kg 75 kg      Telemetry    SR rate 60s- Personally Reviewed  ECG    No new tracing - Personally Reviewed  Physical Exam  Older WM, lying in bed GEN: No acute distress.   Neck: No JVD Cardiac: RRR, no murmurs, rubs, or gallops.  Respiratory: Clear to auscultation bilaterally. GI: Soft, nontender, non-distended  MS: No edema; No deformity. Neuro:  Nonfocal  Psych: Normal affect   Labs    High Sensitivity Troponin:   Recent Labs  Lab 07/28/18 1546 07/28/18 1815 07/28/18 2046  TROPONINIHS 513* 860* 1,608*      Cardiac EnzymesNo results for input(s): TROPONINI in the last 168 hours. No results for input(s): TROPIPOC in the last 168 hours.   Chemistry Recent Labs  Lab 07/28/18 1546 07/28/18 1551 07/28/18 1552 07/29/18  0251  NA 139 141  --  137  K 3.8 3.8  --  3.6  CL 104  --   --  104  CO2 22  --   --  21*  GLUCOSE 108*  --   --  73  BUN 14  --   --  10  CREATININE 1.24  --  0.80 1.02  CALCIUM 9.4  --   --  9.1  PROT 6.7  --   --   --   ALBUMIN 4.3  --   --   --   AST 32  --   --   --   ALT 26  --   --   --   ALKPHOS 65  --   --   --   BILITOT 2.0*  --   --   --   GFRNONAA 57*  --   --  >60  GFRAA >60  --   --  >60  ANIONGAP 13  --   --  12     Hematology Recent Labs  Lab 07/28/18 1546 07/28/18 1551 07/29/18 0251  WBC 7.4  --  8.8  RBC 5.61  --  5.38  HGB 16.5 17.0 15.9  HCT 49.3 50.0 47.0  MCV 87.9  --  87.4  MCH 29.4  --  29.6  MCHC 33.5  --  33.8  RDW 12.5  --  12.4  PLT 131*  --  132*    BNPNo results for input(s): BNP, PROBNP in the last 168 hours.   DDimer No results for input(s): DDIMER in the last 168 hours.   Radiology    No results found.  Cardiac Studies   Cath: 07/28/18   Ost LAD lesion is 20% stenosed.  2nd LPL lesion is 80% stenosed.  LPDA lesion is 80% stenosed.  Dist LAD-1 lesion is 30% stenosed.  Dist LAD-2 lesion is 50% stenosed.  Prox LAD to Mid LAD lesion is 90% stenosed.  Post intervention, there is a 0% residual stenosis.  Prox RCA lesion is 30% stenosed.  A stent was successfully placed.  Acute coronary syndrome with initial ST elevation anteriorly followed by diffuse T wave inversion across the entire precordium and lateral leads secondary to long subtotal stenosis of the proximal to mid LAD in a large wrap around LAD. There was initial TIMI II flow which deteriorated to TIMI I flow prior to the intervention.   Dominant left circumflex vessel with diffuse mid PDA stenosis just proximal to a bifurcation.  Mild 30% stenosis in the proximal RCA.  Successful PCI to the LAD with ultimate insertion of a 3.0 x 38 mm Resolute Onyx DES stent postdilated to 3.3 mm with a diffuse 90% long stenosis being reduced to 0% and improving to  brisk TIMI-3 flow. There is no change in his 20% ostial LAD stenosis 30% and 50% distal LAD stenoses following intervention.  RECOMMENDATION: DAPT for minimum of 12 months. We will continue bivalirudin 4 hours post procedure. Smoking cessation is essential. Will titrate rosuvastatin to 40 mg. Echo Doppler study to assess LV systolic and diastolic function.  Diagnostic Dominance: Left  Intervention    TTE: 07/29/18  IMPRESSIONS   1. The left ventricle has moderate-severely reduced systolic function, with an ejection fraction of 30-35%. The cavity size was normal. Left ventricular diastolic Doppler parameters are consistent with impaired relaxation. There is akinesis of the mid  to apical anteroseptal and inferoseptal walls and the true apex. Severe hypokinesis of the mid to apical anterior wall, the apical lateral wall, and the apical inferior wall. 2. The right ventricle has normal systolic function. The cavity was normal. There is no increase in right ventricular wall thickness. 3. No evidence of mitral valve stenosis. No significant mitral regurgitation. 4. The aortic valve is tricuspid. Mild calcification of the aortic valve. No stenosis of the aortic valve. 5. The aortic root is normal in size and structure. 6. Normal IVC size. No complete TR doppler jet so unable to estimate PA systolic pressure.  Patient Profile     73 y.o. male  with a history of depression, hyperlipidemia on Crestor who was admitted yesterday with an anterior STEMI. He had anterolateral T wave inversion. Hewasbrought emergently to the Cath Lab by Dr. Claiborne Billings and cath radially revealing a a segmental 95% subtotally occluded LAD which was stented. He had minimal disease otherwise. Working with PT, recs for SNF. Waiting for possible acceptance to CIR.   Assessment & Plan    1. STEMI: He underwent successful drug-eluting stent placement to the proximal LAD. The plan is for dual antiplatelet  therapy with aspirin and Brilinta for at least 1 year. He has not had recurrent  chest pain.  --Tolerating carvedilol, losartan, and rosuvastatin.  2. Ischemic cardiomyopathy: LVEF 30 to 35% with akinesis in the mid to apical anterolateral and apical inferior wall. He appears euvolemic. Currently on carvedilol and low-dose losartan. The plan is to transition to [facility] in the outpatient setting. -- recheck BMET today  3. Hyperlipidemia: LDL 28. He is on rosuvastatin.  4. Deconditioning: Physical therapy has recommended SNF/24-hour assistance. Dr. Gwenlyn Found preferred inpatient rehabilitation. Patient's wife broke her wrist the day before the patient's heart attack. Daughter lives in [place], but staying with his wife.He is being followed by social work as well. Possible acceptance to CIR today.   For questions or updates, please contact [place] Please consult [email or web address] for contact info under    Signed, Reino Bellis, NP  08/03/2018, 9:17 AM    Patient seen, examined. Available data reviewed. Agree with findings, assessment, and plan as outlined by Reino Bellis, NP. On my exam today: Vitals:   08/03/18 0536 08/03/18 1000  BP: 134/78 131/73  Pulse: 61 (!) 56  Resp:    Temp: 98.6 F (37 C)   SpO2: 98% 96%   Pt is alert and oriented, elderly male in NAD HEENT: normal Neck: JVP - normal Lungs: CTA bilaterally CV: RRR without murmur or gallop Abd: soft, NT, Positive BS, no hepatomegaly Ext: no C/C/E, distal pulses intact and equal, right radial cath site clear Skin: warm/dry no rash  Tele demonstrates normal sinus rhythm. Pt appears to be doing well with a stable heart rhythm and no evidence of volume overload on today's exam. I think he is medically stable for CIR. Current medical rx is reviewed and is appropriate, including dual antiplatelet therapy with aspirin and ticagrelor, high intensity statin drug, beta-blocker, and an ARB.  Considering his severe LV  dysfunction, would add Spironolactone 12.5 mg daily.  He should have follow-up labs in 1 to 2 weeks.  Sherren Mocha, M.D. 08/03/2018 12:38 PM

## 2018-08-03 NOTE — Progress Notes (Signed)
CARDIAC REHAB PHASE I   PRE:  Rate/Rhythm: 60 SR PACs  BP:  Supine: 131/73  Sitting:   Standing:    SaO2: 98%RA  MODE:  Ambulation: 200 ft Rested and then 270 ft  POST:  Rate/Rhythm: 83 SR  BP:  Supine: 143/78  Sitting:   Standing:    SaO2: 98%RA 1028-1115 Pt walked 200 ft on RA with rolling walker and asst x 1.  Stood and rested a few minutes and then walked 270 ft. Pt doing better keeping close to walker. Still a little bent over when walking. To bathroom after walk. Assisted pt with cleaning after BM and then back to bed. No CP. Awaiting finalization of discharge plan. Bed alarm on.    Graylon Good, RN BSN  08/03/2018 11:09 AM

## 2018-08-04 LAB — SARS CORONAVIRUS 2 BY RT PCR (HOSPITAL ORDER, PERFORMED IN ~~LOC~~ HOSPITAL LAB): SARS Coronavirus 2: NEGATIVE

## 2018-08-04 NOTE — Progress Notes (Signed)
CARDIAC REHAB PHASE I   PRE:  Rate/Rhythm: 75 SR PACs  BP:  Supine:   Sitting: 109/60  Standing:    SaO2: 97%RA  MODE:  Ambulation: 200 ft  Then 200 ft   POST:  Rate/Rhythm: 87 SR  BP:  Supine:   Sitting: 114/73  Standing:    SaO2: 98%RA 1045-1110 Pt walked 400 ft on RA with gait belt and rolling walker and asst x 1. Stopped to rest once . Gets easily distracted. Encouraged to stand upright. Has shuffling gait. To recliner with chair alarm. Call bell in reach.   Graylon Good, RN BSN  08/04/2018 11:06 AM

## 2018-08-04 NOTE — Progress Notes (Signed)
Inpatient Rehab Admissions Coordinator:   Notified by pt's insurance that authorization for CIR has been denied.  Discussed with daughter and she is okay with pt going to SNF for rehab.  Will sign off at let CM and CSW know.   Shann Medal, PT, DPT Admissions Coordinator [phone] 08/04/18  10:04 AM

## 2018-08-04 NOTE — Care Management Important Message (Signed)
Important Message  Patient Details  Name: Austin Mora MRN: [id number] Date of Birth: 06-May-1945   Medicare Important Message Given:  Yes     Shelda Altes 08/04/2018, 12:44 PM

## 2018-08-04 NOTE — TOC Progression Note (Signed)
Transition of Care [facility]) - Progression Note    Patient Details  Name: Austin Mora MRN: [id number] Date of Birth: 22-Jun-1945  [facility] CM/SW Contact  Graves-Bigelow, Ocie Cornfield, RN Phone Number: 08/04/2018, 11:39 AM  Clinical Narrative: Insurance declined CIR- Patient and family is aware. CM did fax the patient out to [facility]. CM did ask NP for COVID test. CM will continue to monitor for additional transition of care needs.     Expected Discharge Plan: IP Rehab Facility(FL2 completed- in case needs to fax out to SNF) Barriers to Discharge: Continued Medical Work up  Expected Discharge Plan and Services Expected Discharge Plan: IP Rehab Facility(FL2 completed- in case needs to fax out to SNF) In-house Referral: (CIR) Discharge Planning Services: CM Consult                                           Social Determinants of Health (SDOH) Interventions    Readmission Risk Interventions No flowsheet data found.

## 2018-08-04 NOTE — Progress Notes (Addendum)
Progress Note  Patient Name: Tracey Harries Date of Encounter: 08/04/2018  Primary Cardiologist: Shelva Majestic, MD   Subjective   No complaints. Just wants to go home.   Inpatient Medications    Scheduled Meds:  aspirin  81 mg Oral Daily   carvedilol  3.125 mg Oral BID WC   losartan  12.5 mg Oral Daily   rosuvastatin  40 mg Oral q1800   sodium chloride flush  3 mL Intravenous Q12H   spironolactone  12.5 mg Oral Daily   ticagrelor  90 mg Oral BID   Continuous Infusions:  sodium chloride 75 mL/hr at 07/29/18 0600   sodium chloride     PRN Meds: sodium chloride, acetaminophen, sodium chloride flush, zolpidem   Vital Signs    Vitals:   08/03/18 1724 08/03/18 2017 08/03/18 2121 08/04/18 0649  BP: 94/67 104/88 111/67 (!) 95/49  Pulse: 63 61 64 (!) 59  Resp:  20  18  Temp: 98 F (36.7 C) 98.3 F (36.8 C)  98 F (36.7 C)  TempSrc: Oral     SpO2: 99%  97% 97%  Weight:    72.6 kg  Height:        Intake/Output Summary (Last 24 hours) at 08/04/2018 0914 Last data filed at 08/04/2018 0249 Gross per 24 hour  Intake 402 ml  Output 550 ml  Net -148 ml   Last 3 Weights 08/04/2018 08/03/2018 08/02/2018  Weight (lbs) 160 lb 163 lb 3.2 oz 163 lb  Weight (kg) 72.576 kg 74.027 kg 73.936 kg      Telemetry    SB with occ PVC - Personally Reviewed  ECG    No new tracing - Personally Reviewed  Physical Exam  Older WM, pleasant sitting up in the chair GEN: No acute distress.   Neck: No JVD Cardiac: RRR, no murmurs, rubs, or gallops.  Respiratory: Clear to auscultation bilaterally. GI: Soft, nontender, non-distended  MS: No edema; No deformity. Neuro:  Nonfocal  Psych: Normal affect   Labs    High Sensitivity Troponin:   Recent Labs  Lab 07/28/18 1546 07/28/18 1815 07/28/18 2046  TROPONINIHS 513* 860* 1,608*      Cardiac EnzymesNo results for input(s): TROPONINI in the last 168 hours. No results for input(s): TROPIPOC in the last 168 hours.    Chemistry Recent Labs  Lab 07/28/18 1546 07/28/18 1551 07/28/18 1552 07/29/18 0251 08/03/18 1026  NA 139 141  --  137 138  K 3.8 3.8  --  3.6 3.9  CL 104  --   --  104 105  CO2 22  --   --  21* 20*  GLUCOSE 108*  --   --  73 93  BUN 14  --   --  10 13  CREATININE 1.24  --  0.80 1.02 1.02  CALCIUM 9.4  --   --  9.1 9.7  PROT 6.7  --   --   --   --   ALBUMIN 4.3  --   --   --   --   AST 32  --   --   --   --   ALT 26  --   --   --   --   ALKPHOS 65  --   --   --   --   BILITOT 2.0*  --   --   --   --   GFRNONAA 57*  --   --  >60 >60  GFRAA >60  --   --  >  60 >60  ANIONGAP 13  --   --  12 13     Hematology Recent Labs  Lab 07/28/18 1546 07/28/18 1551 07/29/18 0251  WBC 7.4  --  8.8  RBC 5.61  --  5.38  HGB 16.5 17.0 15.9  HCT 49.3 50.0 47.0  MCV 87.9  --  87.4  MCH 29.4  --  29.6  MCHC 33.5  --  33.8  RDW 12.5  --  12.4  PLT 131*  --  132*    BNPNo results for input(s): BNP, PROBNP in the last 168 hours.   DDimer No results for input(s): DDIMER in the last 168 hours.   Radiology    No results found.  Cardiac Studies   Cath: 07/28/18   Ost LAD lesion is 20% stenosed.  2nd LPL lesion is 80% stenosed.  LPDA lesion is 80% stenosed.  Dist LAD-1 lesion is 30% stenosed.  Dist LAD-2 lesion is 50% stenosed.  Prox LAD to Mid LAD lesion is 90% stenosed.  Post intervention, there is a 0% residual stenosis.  Prox RCA lesion is 30% stenosed.  A stent was successfully placed.  Acute coronary syndrome with initial ST elevation anteriorly followed by diffuse T wave inversion across the entire precordium and lateral leads secondary to long subtotal stenosis of the proximal to mid LAD in a large wrap around LAD. There was initial TIMI II flow which deteriorated to TIMI I flow prior to the intervention.   Dominant left circumflex vessel with diffuse mid PDA stenosis just proximal to a bifurcation.  Mild 30% stenosis in the proximal RCA.  Successful  PCI to the LAD with ultimate insertion of a 3.0 x 38 mm Resolute Onyx DES stent postdilated to 3.3 mm with a diffuse 90% long stenosis being reduced to 0% and improving to brisk TIMI-3 flow. There is no change in his 20% ostial LAD stenosis 30% and 50% distal LAD stenoses following intervention.  RECOMMENDATION: DAPT for minimum of 12 months. We will continue bivalirudin 4 hours post procedure. Smoking cessation is essential. Will titrate rosuvastatin to 40 mg. Echo Doppler study to assess LV systolic and diastolic function.  Diagnostic Dominance: Left  Intervention    TTE: 07/29/18  IMPRESSIONS   1. The left ventricle has moderate-severely reduced systolic function, with an ejection fraction of 30-35%. The cavity size was normal. Left ventricular diastolic Doppler parameters are consistent with impaired relaxation. There is akinesis of the mid  to apical anteroseptal and inferoseptal walls and the true apex. Severe hypokinesis of the mid to apical anterior wall, the apical lateral wall, and the apical inferior wall. 2. The right ventricle has normal systolic function. The cavity was normal. There is no increase in right ventricular wall thickness. 3. No evidence of mitral valve stenosis. No significant mitral regurgitation. 4. The aortic valve is tricuspid. Mild calcification of the aortic valve. No stenosis of the aortic valve. 5. The aortic root is normal in size and structure. 6. Normal IVC size. No complete TR doppler jet so unable to estimate PA systolic pressure.  Patient Profile     73 y.o. male with a history of depression, hyperlipidemia on Crestor who was admitted yesterday with an anterior STEMI. He had anterolateral T wave inversion. Hewasbrought emergently to the Cath Lab by Dr. Claiborne Billings and cath radially revealing a a segmental 95% subtotally occluded LAD which was stented. He had minimal disease otherwise. Working with PT, recs for SNF. Waiting for  possible acceptance to CIR.  Assessment & Plan    1. STEMI: He underwent successful drug-eluting stent placement to the proximal LAD. The plan is for dual antiplatelet therapy with aspirin and Brilinta for at least 1 year. He has not had recurrent chest pain.  --Tolerating carvedilol, losartan, and rosuvastatin.  2. Ischemic cardiomyopathy: LVEF 30 to 35% with akinesis in the mid to apical anterolateral and apical inferior wall. He appears euvolemic. Currently on carvedilol and low-dose losartan. The plan is to transition to [facility] in the outpatient setting. Arlyce Harman added yesterday. -- labs stable  3. Hyperlipidemia: LDL 28. He is on rosuvastatin.  4. Deconditioning: Physical therapy has recommended SNF/24-hour assistance. Dr. Gwenlyn Found preferred inpatient rehabilitation. Patient's wife broke her wrist the day before the patient's heart attack. Daughter lives in [place], but staying with his wife.He is being followed by social work as well. Talked with CM, we awaiting insurance approval.  For questions or updates, please contact [facility] Please consult [email or web address] for contact info under   Signed, Reino Bellis, NP  08/04/2018, 9:14 AM    I have personally seen and examined this patient with Reino Bellis, NP. I agree with the assessment and plan as outlined above. He was admitted with an anterior STEMI. A drug eluting stent was placed in the LAD. He has minimal CAD otherwise. Doing well this am. No chest pain. Tolerating his medications. Continue ASA/Brilinta/statin, beta blocker, ARB and aldactone. LVEF=30-35% post anterior MI but given his poor functional status and memory issues, would not consider him to be a good candidate for an ICD or Lifevest.  PT has recommended SNF or 24 hour assistance. Awaiting final approval for inpatient rehab.  No changes today.   Lauree Chandler 08/04/2018 10:09 AM

## 2018-08-04 NOTE — Progress Notes (Signed)
Physical Therapy Treatment Patient Details Name: Austin Mora MRN: [id number] DOB: [birth year] Today's Date: 08/04/2018    History of Present Illness Pt is a 73 y/o male admitted secondary to chest pain. Found to have STEMI and s/p heart cath with stent placement. PMH includes DM, HTN, GERD, and memory disorder.     PT Comments    Pt pleasant and reports sleeping well. Pt with increased gait and activity tolerance but limited by safety, balance and general cognitive deficits. Pt reports wife cannot assist currently and he lives in a 2nd floor apartment and is currently not yet able to attempt stairs due to fatigue. Will continue to maximize balance, strength and activity tolerance. D/C to sNF remains appropriate.   HR 65-82 with gait, 98% on RA with 1/4 DOE Pt reports RPE 12 with hall ambulation    Follow Up Recommendations  SNF;Supervision/Assistance - 24 hour     Equipment Recommendations  Rolling walker with 5" wheels    Recommendations for Other Services       Precautions / Restrictions Precautions Precautions: Fall    Mobility  Bed Mobility Overal bed mobility: Modified Independent Bed Mobility: Supine to Sit           General bed mobility comments: increased time  Transfers Overall transfer level: Needs assistance   Transfers: Sit to/from Stand Sit to Stand: Min guard         General transfer comment: guarding for safety and balance with cues for hand placement with descent to chair as pt holding RW  Ambulation/Gait Ambulation/Gait assistance: Min guard Gait Distance (Feet): 450 Feet Assistive device: Rolling walker (2 wheeled) Gait Pattern/deviations: Shuffle;Decreased stride length;Trunk flexed Gait velocity: 80'/ 60 sec=1.33 Gait velocity interpretation: <1.31 ft/sec, indicative of household ambulator General Gait Details: pt with shuffling gait and unable to increase foot clearance with cues. Cues for posture, position in RW, direction and safety.  Pt required 1 standing rest and walks with very slow gait   Stairs             Wheelchair Mobility    Modified Rankin (Stroke Patients Only)       Balance Overall balance assessment: Needs assistance   Sitting balance-Leahy Scale: Fair     Standing balance support: Bilateral upper extremity supported;During functional activity Standing balance-Leahy Scale: Poor Standing balance comment: Reliant on BUE support                             Cognition Arousal/Alertness: Awake/alert Behavior During Therapy: Flat affect Overall Cognitive Status: Impaired/Different from baseline Area of Impairment: Memory;Safety/judgement;Orientation                 Orientation Level: Disoriented to;Time   Memory: Decreased short-term memory   Safety/Judgement: Decreased awareness of deficits;Decreased awareness of safety     General Comments: pt reports being tired but sleeping well, not oriented to day, unable to recall plan for session despite education x 2 during session      Exercises General Exercises - Lower Extremity Long Arc Quad: AROM;15 reps;Both;Seated Hip Flexion/Marching: AROM;15 reps;Both;Seated    General Comments        Pertinent Vitals/Pain Pain Assessment: No/denies pain    Home Living                      Prior Function            PT Goals (current goals can now be  found in the care plan section) Progress towards PT goals: Progressing toward goals    Frequency    Min 2X/week      PT Plan Current plan remains appropriate    Co-evaluation              AM-PAC PT "6 Clicks" Mobility   Outcome Measure  Help needed turning from your back to your side while in a flat bed without using bedrails?: None Help needed moving from lying on your back to sitting on the side of a flat bed without using bedrails?: None Help needed moving to and from a bed to a chair (including a wheelchair)?: A Little Help needed standing  up from a chair using your arms (e.g., wheelchair or bedside chair)?: A Little Help needed to walk in hospital room?: A Little Help needed climbing 3-5 steps with a railing? : A Lot 6 Click Score: 19    End of Session Equipment Utilized During Treatment: Gait belt Activity Tolerance: Patient tolerated treatment well Patient left: in chair;with call bell/phone within reach;with chair alarm set Nurse Communication: Mobility status PT Visit Diagnosis: Unsteadiness on feet (R26.81);Muscle weakness (generalized) (M62.81);Difficulty in walking, not elsewhere classified (R26.2)     Time: 6438-3779 PT Time Calculation (min) (ACUTE ONLY): 29 min  Charges:  $Gait Training: 8-22 mins $Therapeutic Exercise: 8-22 mins                     Marcele Kosta Pam Drown, PT Acute Rehabilitation Services Pager: [phone] Office: [place] 08/04/2018, 11:33 AM

## 2018-08-04 NOTE — [place] FL2 (Signed)
[place] MEDICAID FL2 LEVEL OF CARE SCREENING TOOL     IDENTIFICATION  Patient Name: Austin Mora Birthdate: [birth year] Sex: male Admission Date (Current Location): 07/28/2018  [facility] and [place] Number:  Herbalist and Address:  [facility] [place], [place] [address] [place], [place] [address]      Provider Number: [id number]  Attending Physician Name and Address:  Troy Sine, MD  Relative Name and Phone Number:       Current Level of Care: Hospital Recommended Level of Care: [place] Prior Approval Number:    Date Approved/Denied:   PASRR Number: [id number]  Discharge Plan: SNF    Current Diagnoses: Patient Active Problem List   Diagnosis Date Noted  . Acute ST elevation myocardial infarction (STEMI) involving left anterior descending (LAD) coronary artery ([place]) 07/28/2018  . ST elevation myocardial infarction involving left anterior descending (LAD) coronary artery ([place])   . 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 Parkinson's disease ([place]) 07/16/2018  . Memory disorder 07/01/2018    Orientation RESPIRATION BLADDER Height & Weight     Self, Time, Situation, Place(fluctuates at times.)  Normal Continent Weight: 72.6 kg Height:  5\' 9"  (175.3 cm)  BEHAVIORAL SYMPTOMS/MOOD NEUROLOGICAL BOWEL NUTRITION STATUS  (N/A)   Continent Diet(Regular Diet-Thin Liquids.)  AMBULATORY STATUS COMMUNICATION OF NEEDS Skin   Limited Assist(Assist x 1 and gait belt) Verbally Normal                       Personal Care Assistance Level of Assistance  Bathing, Feeding, Dressing Bathing Assistance: Maximum assistance Feeding assistance: Limited assistance Dressing Assistance: Maximum assistance     Functional Limitations Info  Sight, Hearing, Speech Sight Info: Adequate Hearing Info: Adequate Speech Info: Adequate    SPECIAL CARE FACTORS FREQUENCY  PT (By licensed PT), OT (By licensed OT)     PT Frequency:  5 x week OT Frequency: 5 x week            Contractures Contractures Info: Not present    Additional Factors Info  Allergies, Code Status, Psychotropic Code Status Info: Full Code Allergies Info: Sulfa, Aricept Psychotropic Info: Lexapro 10 mg tablet daily Insulin Sliding Scale Info: (N/A)       Current Medications (08/04/2018):  This is the current hospital active medication list Current Facility-Administered Medications  Medication Dose Route Frequency Provider Last Rate Last Dose  . 0.9 %  sodium chloride infusion   Intravenous Continuous Troy Sine, MD 75 mL/hr at 07/29/18 0600    . 0.9 %  sodium chloride infusion  250 mL Intravenous PRN Troy Sine, MD      . acetaminophen (TYLENOL) tablet 650 mg  650 mg Oral Q4H PRN Troy Sine, MD      . aspirin chewable tablet 81 mg  81 mg Oral Daily Troy Sine, MD   81 mg at 08/03/18 1015  . carvedilol (COREG) tablet 3.125 mg  3.125 mg Oral BID WC Reino Bellis B, NP   3.125 mg at 08/03/18 1726  . losartan (COZAAR) tablet 12.5 mg  12.5 mg Oral Daily Bhagat, Bhavinkumar, PA   12.5 mg at 08/03/18 1015  . rosuvastatin (CRESTOR) tablet 40 mg  40 mg Oral q1800 Troy Sine, MD   40 mg at 08/03/18 1726  . sodium chloride flush (NS) 0.9 % injection 3 mL  3 mL Intravenous Q12H Troy Sine, MD   3 mL at  08/03/18 2117  . sodium chloride flush (NS) 0.9 % injection 3 mL  3 mL Intravenous PRN Troy Sine, MD      . spironolactone (ALDACTONE) tablet 12.5 mg  12.5 mg Oral Daily Sherren Mocha, MD   12.5 mg at 08/03/18 1726  . ticagrelor (BRILINTA) tablet 90 mg  90 mg Oral BID Troy Sine, MD   90 mg at 08/03/18 2117  . zolpidem (AMBIEN) tablet 5 mg  5 mg Oral QHS PRN Troy Sine, MD       Facility-Administered Medications Ordered in Other Encounters  Medication Dose Route Frequency Provider Last Rate Last Dose  . 0.9 %  sodium chloride infusion    Continuous PRN Troy Sine, MD 75 mL/hr at 07/29/18 0003 500  mL at 07/29/18 0003  . ticagrelor (BRILINTA) tablet    PRN Troy Sine, MD   90 mg at 07/29/18 3491     Discharge Medications: Please see discharge summary for a list of discharge medications.  Relevant Imaging Results:  Relevant Lab Results:   Additional Information [id number]  Bethena Roys, RN

## 2018-08-04 NOTE — TOC Progression Note (Addendum)
Transition of Care [facility]) - Progression Note    Patient Details  Name: Austin Mora MRN: [id number] Date of Birth: 05/21/1945  Transition of [facility]) CM/SW Contact  Eileen Stanford, LCSW Phone Number: 08/04/2018, 3:36 PM  Clinical Narrative:   Awaiting COVID-19 test results. MD planning on dc tomorrow morning- pt will d/c to [facility]. Pt's daughter aware, agreeable, and appreciative.    Expected Discharge Plan: IP Rehab Facility(FL2 completed- in case needs to fax out to SNF) Barriers to Discharge: Continued Medical Work up  Expected Discharge Plan and Services Expected Discharge Plan: IP Rehab Facility(FL2 completed- in case needs to fax out to SNF) In-house Referral: (CIR) Discharge Planning Services: CM Consult                                           Social Determinants of Health (SDOH) Interventions    Readmission Risk Interventions No flowsheet data found.

## 2018-08-05 ENCOUNTER — Encounter (HOSPITAL_COMMUNITY): Payer: Self-pay | Admitting: Cardiology

## 2018-08-05 DIAGNOSIS — Z9049 Acquired absence of other specified parts of digestive tract: Secondary | ICD-10-CM | POA: Diagnosis not present

## 2018-08-05 DIAGNOSIS — Z8249 Family history of ischemic heart disease and other diseases of the circulatory system: Secondary | ICD-10-CM | POA: Diagnosis not present

## 2018-08-05 DIAGNOSIS — W010XXA Fall on same level from slipping, tripping and stumbling without subsequent striking against object, initial encounter: Secondary | ICD-10-CM | POA: Diagnosis present

## 2018-08-05 DIAGNOSIS — K219 Gastro-esophageal reflux disease without esophagitis: Secondary | ICD-10-CM | POA: Diagnosis present

## 2018-08-05 DIAGNOSIS — R413 Other amnesia: Secondary | ICD-10-CM | POA: Diagnosis not present

## 2018-08-05 DIAGNOSIS — Z79899 Other long term (current) drug therapy: Secondary | ICD-10-CM | POA: Diagnosis not present

## 2018-08-05 DIAGNOSIS — I5020 Unspecified systolic (congestive) heart failure: Secondary | ICD-10-CM | POA: Diagnosis not present

## 2018-08-05 DIAGNOSIS — F17210 Nicotine dependence, cigarettes, uncomplicated: Secondary | ICD-10-CM | POA: Diagnosis present

## 2018-08-05 DIAGNOSIS — I959 Hypotension, unspecified: Secondary | ICD-10-CM | POA: Diagnosis present

## 2018-08-05 DIAGNOSIS — I951 Orthostatic hypotension: Secondary | ICD-10-CM | POA: Diagnosis not present

## 2018-08-05 DIAGNOSIS — I2102 ST elevation (STEMI) myocardial infarction involving left anterior descending coronary artery: Secondary | ICD-10-CM | POA: Diagnosis not present

## 2018-08-05 DIAGNOSIS — Z955 Presence of coronary angioplasty implant and graft: Secondary | ICD-10-CM | POA: Diagnosis not present

## 2018-08-05 DIAGNOSIS — W19XXXA Unspecified fall, initial encounter: Secondary | ICD-10-CM | POA: Diagnosis not present

## 2018-08-05 DIAGNOSIS — R55 Syncope and collapse: Secondary | ICD-10-CM | POA: Diagnosis not present

## 2018-08-05 DIAGNOSIS — N179 Acute kidney failure, unspecified: Secondary | ICD-10-CM | POA: Diagnosis present

## 2018-08-05 DIAGNOSIS — I10 Essential (primary) hypertension: Secondary | ICD-10-CM | POA: Diagnosis present

## 2018-08-05 DIAGNOSIS — R410 Disorientation, unspecified: Secondary | ICD-10-CM | POA: Diagnosis not present

## 2018-08-05 DIAGNOSIS — I2109 ST elevation (STEMI) myocardial infarction involving other coronary artery of anterior wall: Secondary | ICD-10-CM | POA: Diagnosis not present

## 2018-08-05 DIAGNOSIS — Z20828 Contact with and (suspected) exposure to other viral communicable diseases: Secondary | ICD-10-CM | POA: Diagnosis not present

## 2018-08-05 DIAGNOSIS — Z888 Allergy status to other drugs, medicaments and biological substances status: Secondary | ICD-10-CM | POA: Diagnosis not present

## 2018-08-05 DIAGNOSIS — F329 Major depressive disorder, single episode, unspecified: Secondary | ICD-10-CM | POA: Diagnosis present

## 2018-08-05 DIAGNOSIS — E119 Type 2 diabetes mellitus without complications: Secondary | ICD-10-CM | POA: Diagnosis present

## 2018-08-05 DIAGNOSIS — I213 ST elevation (STEMI) myocardial infarction of unspecified site: Secondary | ICD-10-CM | POA: Diagnosis present

## 2018-08-05 DIAGNOSIS — Z7902 Long term (current) use of antithrombotics/antiplatelets: Secondary | ICD-10-CM | POA: Diagnosis not present

## 2018-08-05 DIAGNOSIS — Z1159 Encounter for screening for other viral diseases: Secondary | ICD-10-CM | POA: Diagnosis not present

## 2018-08-05 DIAGNOSIS — I2510 Atherosclerotic heart disease of native coronary artery without angina pectoris: Secondary | ICD-10-CM | POA: Diagnosis present

## 2018-08-05 DIAGNOSIS — N170 Acute kidney failure with tubular necrosis: Secondary | ICD-10-CM | POA: Diagnosis not present

## 2018-08-05 DIAGNOSIS — Z882 Allergy status to sulfonamides status: Secondary | ICD-10-CM | POA: Diagnosis not present

## 2018-08-05 DIAGNOSIS — Z7401 Bed confinement status: Secondary | ICD-10-CM | POA: Diagnosis not present

## 2018-08-05 DIAGNOSIS — I255 Ischemic cardiomyopathy: Secondary | ICD-10-CM | POA: Diagnosis not present

## 2018-08-05 DIAGNOSIS — Z7982 Long term (current) use of aspirin: Secondary | ICD-10-CM | POA: Diagnosis not present

## 2018-08-05 DIAGNOSIS — I499 Cardiac arrhythmia, unspecified: Secondary | ICD-10-CM | POA: Diagnosis not present

## 2018-08-05 DIAGNOSIS — R51 Headache: Secondary | ICD-10-CM | POA: Diagnosis not present

## 2018-08-05 DIAGNOSIS — R5381 Other malaise: Secondary | ICD-10-CM | POA: Diagnosis not present

## 2018-08-05 DIAGNOSIS — G20 Parkinson's disease: Secondary | ICD-10-CM | POA: Diagnosis not present

## 2018-08-05 DIAGNOSIS — E785 Hyperlipidemia, unspecified: Secondary | ICD-10-CM | POA: Diagnosis present

## 2018-08-05 DIAGNOSIS — M2550 Pain in unspecified joint: Secondary | ICD-10-CM | POA: Diagnosis not present

## 2018-08-05 DIAGNOSIS — G4752 REM sleep behavior disorder: Secondary | ICD-10-CM | POA: Diagnosis not present

## 2018-08-05 MED ORDER — NITROGLYCERIN 0.4 MG SL SUBL: 0.4000 mg | SUBLINGUAL_TABLET | SUBLINGUAL | 2 refills | Status: AC | PRN

## 2018-08-05 MED ORDER — ROSUVASTATIN CALCIUM 40 MG PO TABS: 40.0000 mg | ORAL_TABLET | Freq: Every day | ORAL | Status: AC

## 2018-08-05 MED ORDER — SPIRONOLACTONE 25 MG PO TABS: 12.5000 mg | ORAL_TABLET | Freq: Every day | ORAL | Status: DC

## 2018-08-05 MED ORDER — TICAGRELOR 90 MG PO TABS: 90.0000 mg | ORAL_TABLET | Freq: Two times a day (BID) | ORAL | Status: AC

## 2018-08-05 MED ORDER — LOSARTAN POTASSIUM 25 MG PO TABS: 12.5000 mg | ORAL_TABLET | Freq: Every day | ORAL | Status: DC

## 2018-08-05 MED ORDER — CARVEDILOL 3.125 MG PO TABS: 3.1250 mg | ORAL_TABLET | Freq: Two times a day (BID) | ORAL | Status: DC

## 2018-08-05 NOTE — Discharge Summary (Signed)
Discharge Summary    Patient ID: Austin Mora,  MRN: [id number], DOB/AGE: 10/27/1945 73 y.o.  Admit date: 07/28/2018 Discharge date: 08/05/2018  Primary Care Provider: Leanna Mora Primary Cardiologist: Dr. Claiborne Billings   Discharge Diagnoses    Principal Problem:   Acute ST elevation myocardial infarction (STEMI) involving left anterior descending (LAD) coronary artery [facility]) Active Problems:   Memory disorder   ST elevation myocardial infarction involving left anterior descending (LAD) coronary artery (HCC)   Ischemic cardiomyopathy   Hyperlipidemia   Allergies Allergies  Allergen Reactions  . Aricept [Donepezil Hcl]     Vivid dreams  . Sulfa Antibiotics Hives    Diagnostic Studies/Procedures    Cath: 07/28/18   Ost LAD lesion is 20% stenosed.  2nd LPL lesion is 80% stenosed.  LPDA lesion is 80% stenosed.  Dist LAD-1 lesion is 30% stenosed.  Dist LAD-2 lesion is 50% stenosed.  Prox LAD to Mid LAD lesion is 90% stenosed.  Post intervention, there is a 0% residual stenosis.  Prox RCA lesion is 30% stenosed.  A stent was successfully placed.   Acute coronary syndrome with initial ST elevation anteriorly followed by diffuse T wave inversion across the entire precordium and lateral leads secondary to long subtotal stenosis of the proximal to mid LAD in a large wrap around LAD.  There was initial TIMI II flow which deteriorated to TIMI I flow prior to the intervention.   Dominant left circumflex vessel with diffuse mid PDA stenosis just proximal to a bifurcation.  Mild 30% stenosis in the proximal RCA.  Successful PCI to the LAD with ultimate insertion of a 3.0 x 38 mm Resolute Onyx DES stent postdilated to 3.3 mm with a diffuse 90% long stenosis being reduced to 0% and improving to brisk TIMI-3 flow.  There is no change in his 20% ostial LAD stenosis 30% and 50% distal LAD stenoses following intervention.  RECOMMENDATION: DAPT for minimum of 12 months.  We  will continue bivalirudin 4 hours post procedure.  Smoking cessation is essential.  Will titrate rosuvastatin to 40 mg.   Echo Doppler study to assess LV systolic and diastolic function.   Diagnostic Dominance: Left  Intervention   TTE: 07/29/18  IMPRESSIONS    1. The left ventricle has moderate-severely reduced systolic function, with an ejection fraction of 30-35%. The cavity size was normal. Left ventricular diastolic Doppler parameters are consistent with impaired relaxation. There is akinesis of the mid  to apical anteroseptal and inferoseptal walls and the true apex. Severe hypokinesis of the mid to apical anterior wall, the apical lateral wall, and the apical inferior wall.  2. The right ventricle has normal systolic function. The cavity was normal. There is no increase in right ventricular wall thickness.  3. No evidence of mitral valve stenosis. No significant mitral regurgitation.  4. The aortic valve is tricuspid. Mild calcification of the aortic valve. No stenosis of the aortic valve.  5. The aortic root is normal in size and structure.  6. Normal IVC size. No complete TR doppler jet so unable to estimate PA systolic pressure.  _____________   History of Present Illness     Austin Mora is a 73 yo male with PMH of depression, DM, GERD, HL, and HTN. He lives at home with his wife. Reported to have some issues with his memory and recently started on Aricept as an outpatient. Being followed by neurology. Reported being in his usual state of health until the morning of admission.  Developed centralized chest pain with some shortness of breath. No radiation into the jaw or arms. Called EMS when symptoms did not resolve. EKG showed SR with large TWI in precordial leads with slight STE in anterior leads. CODE STEMI called in the field. He was given 324 ASA and 2 SL nitro en route. Brought directly to the cath lab for emergent cardiac catheterization.   Hospital Course     1.  AnteriorSTEMI: He underwent successful drug-eluting stent placement to the proximal LAD. hsT peaked at 1608. Plan for DAPT with ASA/Brilinta for at least one year. No recurrent chest pain during admission. Worked well with cardiac rehab.  --Tolerated the addition of carvedilol, losartan, spiro and rosuvastatin.  2. Ischemic cardiomyopathy:LVEF 30 to 35%with akinesis in the mid to apical anterolateral and apical inferior wall. Remains euvolemic. Started on carvedilol, low-dose losartan and spiro. Consider transition to [facility] as an outpatient. Felt not to be a good candidate for lifevest 2/2 to frail state and memory issues.   3. Hyperlipidemia: LDL 28. He is on rosuvastatin.  4. Deconditioning:Physical therapy has recommended SNF/24-hour assistance. Patient's wife broke her wrist the day before the patient's heart attack.Daughter lives in [place]. Attempted placement at [facility] but insurance denied. Did receive approval for SNF. CM and SW assisted with placement.  General: Well developed, well nourished, male appearing in no acute distress. Head: Normocephalic, atraumatic.  Neck: Supple without bruits, JVD. Lungs:  Resp regular and unlabored, CTA. Heart: RRR, S1, S2, no S3, S4, or murmur; no rub. Abdomen: Soft, non-tender, non-distended with normoactive bowel sounds. Extremities: No clubbing, cyanosis, edema. Distal pedal pulses are 2+ bilaterally. Right radial cath site stable without bruising or hematoma Neuro: Alert and oriented X 3. Moves all extremities spontaneously. Psych: Normal affect.  Austin Mora was seen by Dr. Burt Knack and determined stable for discharge home. Follow up in the office has been arranged. Medications are listed below.   _____________  Discharge Vitals Blood pressure 104/67, pulse 60, temperature 97.6 F (36.4 C), resp. rate 20, height 5\' 9"  (1.753 m), weight 73 kg, SpO2 93 %.  Filed Weights   08/03/18 0531 08/04/18 0649 08/05/18 0620  Weight: 74 kg 72.6  kg 73 kg    Labs & Radiologic Studies    CBC No results for input(s): WBC, NEUTROABS, HGB, HCT, MCV, PLT in the last 72 hours. Basic Metabolic Panel Recent Labs    08/03/18 1026  NA 138  K 3.9  CL 105  CO2 20*  GLUCOSE 93  BUN 13  CREATININE 1.02  CALCIUM 9.7   Liver Function Tests No results for input(s): AST, ALT, ALKPHOS, BILITOT, PROT, ALBUMIN in the last 72 hours. No results for input(s): LIPASE, AMYLASE in the last 72 hours. Cardiac Enzymes No results for input(s): CKTOTAL, CKMB, CKMBINDEX, TROPONINI in the last 72 hours. BNP Invalid input(s): POCBNP D-Dimer No results for input(s): DDIMER in the last 72 hours. Hemoglobin A1C No results for input(s): HGBA1C in the last 72 hours. Fasting Lipid Panel No results for input(s): CHOL, HDL, LDLCALC, TRIG, CHOLHDL, LDLDIRECT in the last 72 hours. Thyroid Function Tests No results for input(s): TSH, T4TOTAL, T3FREE, THYROIDAB in the last 72 hours.  Invalid input(s): FREET3 _____________  No results found. Disposition   Pt is being discharged home today in good condition.  Follow-up Plans & Appointments     Contact information for follow-up providers    Erlene Quan, PA-C Follow up on 08/18/2018.   Specialties: Cardiology, Radiology Why: 10:45am for  your follow up appt.  Contact information: [address] [place] [address] [phone]            Contact information for after-discharge care    Destination    [facility] .   Service: Skilled Nursing Contact information: [address] [place] [phone]                 Discharge Instructions    Amb Referral to Cardiac Rehabilitation   Complete by: As directed    Diagnosis:  STEMI Coronary Stents     After initial evaluation and assessments completed: Virtual Based Care may be provided alone or in conjunction with Phase 2 Cardiac Rehab based on patient  barriers.: Yes       Discharge Medications     Medication List    TAKE these medications   ARIPiprazole 5 MG tablet Commonly known as: ABILIFY Take 5 mg by mouth daily.   aspirin 81 MG tablet Take 81 mg by mouth daily.   carvedilol 3.125 MG tablet Commonly known as: COREG Take 1 tablet (3.125 mg total) by mouth 2 (two) times daily with a meal.   escitalopram 10 MG tablet Commonly known as: LEXAPRO Take 10 mg by mouth daily.   losartan 25 MG tablet Commonly known as: COZAAR Take 0.5 tablets (12.5 mg total) by mouth daily.   nitroGLYCERIN 0.4 MG SL tablet Commonly known as: Nitrostat Place 1 tablet (0.4 mg total) under the tongue every 5 (five) minutes as needed.   omeprazole 40 MG capsule Commonly known as: PRILOSEC Take 1 capsule by mouth daily.   rosuvastatin 40 MG tablet Commonly known as: CRESTOR Take 1 tablet (40 mg total) by mouth daily at 6 PM. What changed:   medication strength  how much to take  when to take this  additional instructions   spironolactone 25 MG tablet Commonly known as: ALDACTONE Take 0.5 tablets (12.5 mg total) by mouth daily.   ticagrelor 90 MG Tabs tablet Commonly known as: BRILINTA Take 1 tablet (90 mg total) by mouth 2 (two) times daily.        Acute coronary syndrome (MI, NSTEMI, STEMI, etc) this admission?: Yes.     AHA/ACC Clinical Performance & Quality Measures: 1. Aspirin prescribed? - Yes 2. ADP Receptor Inhibitor (Plavix/Clopidogrel, Brilinta/Ticagrelor or Effient/Prasugrel) prescribed (includes medically managed patients)? - Yes 3. Beta Blocker prescribed? - Yes 4. High Intensity Statin (Lipitor 40-80mg  or Crestor 20-40mg ) prescribed? - Yes 5. EF assessed during THIS hospitalization? - Yes 6. For EF <40%, was ACEI/ARB prescribed? - Yes 7. For EF <40%, Aldosterone Antagonist (Spironolactone or Eplerenone) prescribed? - Yes 8. Cardiac Rehab Phase II ordered (Included Medically managed Patients)? - Yes      Outstanding Labs/Studies   FLP/LFTs in 6 weeks. BMET at follow up.   Duration of Discharge Encounter   Greater than 30 minutes including physician time.  Signed, Reino Bellis NP-C 08/05/2018, 9:50 AM

## 2018-08-05 NOTE — Progress Notes (Signed)
CARDIAC REHAB PHASE I   PRE:  Rate/Rhythm: 65 SR  BP:  Supine: 108/71  Sitting:   Standing:    SaO2: 97%RA  MODE:  Ambulation: 310 ft   POST:  Rate/Rhythm: 83 SR  BP:  Supine:   Sitting: 121/83  Standing:    SaO2: 97%RA 0955-1027 Pt walked 310 ft on RA with rolling walker, gait belt use and asst x 1. Pt easily distracted. Pt knew to try to stay close to walker. To bed with alarm on after walk. For discharge to [employer].   Graylon Good, RN BSN  08/05/2018 10:24 AM

## 2018-08-05 NOTE — TOC Transition Note (Signed)
Transition of Care [facility]) - CM/SW Discharge Note   Patient Details  Name: Austin Mora MRN: [id number] Date of Birth: 1946/01/24  [facility] CM/SW Contact:  Eileen Stanford, LCSW Phone Number: 08/05/2018, 9:56 AM   Clinical Narrative: Clinical Social Worker facilitated patient discharge including contacting patient family and facility to confirm patient discharge plans.  Clinical information faxed to facility and family agreeable with plan.  CSW arranged ambulance transport via [facility] to [facility].  RN to call [phone] for report prior to discharge.   Final next level of care: Skilled Nursing Facility Barriers to Discharge: No Barriers Identified   Patient Goals and CMS Choice   CMS Medicare.gov Compare Post Acute Care list provided to:: Patient Choice offered to / list presented to : Patient  Discharge Placement                Patient to be transferred to facility by: [facility] Name of family member notified: Amy- daughter Patient and family notified of of transfer: 08/05/18  Discharge Plan and Services In-house Referral: (CIR) Discharge Planning Services: CM Consult                                 Social Determinants of Health ([place]) Interventions     Readmission Risk Interventions No flowsheet data found.

## 2018-08-06 ENCOUNTER — Telehealth (HOSPITAL_COMMUNITY): Payer: Self-pay

## 2018-08-06 NOTE — Telephone Encounter (Signed)
Pt insurance is active and benefits verified through [place] $20.00, DED $500/0 met, out of pocket $6,700/$190 met, co-insurance 0. no pre-authorizati, REF# [id number]  Will contact patient to see if he is interested in the Cardiac Rehab Program. If interested, patient will need to complete follow up appt. Once completed, patient will be contacted for scheduling upon review by the RN Navigator.

## 2018-08-06 NOTE — Telephone Encounter (Signed)
Attempted to call patient in regards to Cardiac Rehab - LM on VM °Gloria W. Support Rep II °

## 2018-08-07 DIAGNOSIS — R413 Other amnesia: Secondary | ICD-10-CM | POA: Diagnosis not present

## 2018-08-07 DIAGNOSIS — I2102 ST elevation (STEMI) myocardial infarction involving left anterior descending coronary artery: Secondary | ICD-10-CM | POA: Diagnosis not present

## 2018-08-07 DIAGNOSIS — R5381 Other malaise: Secondary | ICD-10-CM | POA: Diagnosis not present

## 2018-08-07 DIAGNOSIS — I5020 Unspecified systolic (congestive) heart failure: Secondary | ICD-10-CM | POA: Diagnosis not present

## 2018-08-12 ENCOUNTER — Inpatient Hospital Stay (HOSPITAL_COMMUNITY)
Admission: EM | Admit: 2018-08-12 | Discharge: 2018-08-19 | DRG: 281 | Disposition: A | Discharge status: Home Health | Payer: Medicare PPO | Attending: Internal Medicine | Admitting: Internal Medicine

## 2018-08-12 ENCOUNTER — Encounter (HOSPITAL_COMMUNITY): Payer: Self-pay | Admitting: Internal Medicine

## 2018-08-12 ENCOUNTER — Emergency Department (HOSPITAL_COMMUNITY): Payer: Medicare PPO

## 2018-08-12 ENCOUNTER — Other Ambulatory Visit: Payer: Self-pay

## 2018-08-12 DIAGNOSIS — Z888 Allergy status to other drugs, medicaments and biological substances status: Secondary | ICD-10-CM | POA: Diagnosis not present

## 2018-08-12 DIAGNOSIS — Z7982 Long term (current) use of aspirin: Secondary | ICD-10-CM

## 2018-08-12 DIAGNOSIS — F17210 Nicotine dependence, cigarettes, uncomplicated: Secondary | ICD-10-CM | POA: Diagnosis present

## 2018-08-12 DIAGNOSIS — I2510 Atherosclerotic heart disease of native coronary artery without angina pectoris: Secondary | ICD-10-CM | POA: Diagnosis present

## 2018-08-12 DIAGNOSIS — I951 Orthostatic hypotension: Secondary | ICD-10-CM | POA: Diagnosis present

## 2018-08-12 DIAGNOSIS — I959 Hypotension, unspecified: Secondary | ICD-10-CM

## 2018-08-12 DIAGNOSIS — R51 Headache: Secondary | ICD-10-CM | POA: Diagnosis not present

## 2018-08-12 DIAGNOSIS — K219 Gastro-esophageal reflux disease without esophagitis: Secondary | ICD-10-CM | POA: Diagnosis present

## 2018-08-12 DIAGNOSIS — Z7902 Long term (current) use of antithrombotics/antiplatelets: Secondary | ICD-10-CM | POA: Diagnosis not present

## 2018-08-12 DIAGNOSIS — E785 Hyperlipidemia, unspecified: Secondary | ICD-10-CM | POA: Diagnosis present

## 2018-08-12 DIAGNOSIS — G20 Parkinson's disease: Secondary | ICD-10-CM | POA: Diagnosis present

## 2018-08-12 DIAGNOSIS — Z8249 Family history of ischemic heart disease and other diseases of the circulatory system: Secondary | ICD-10-CM | POA: Diagnosis not present

## 2018-08-12 DIAGNOSIS — E119 Type 2 diabetes mellitus without complications: Secondary | ICD-10-CM | POA: Diagnosis present

## 2018-08-12 DIAGNOSIS — R413 Other amnesia: Secondary | ICD-10-CM | POA: Diagnosis not present

## 2018-08-12 DIAGNOSIS — N179 Acute kidney failure, unspecified: Secondary | ICD-10-CM | POA: Diagnosis present

## 2018-08-12 DIAGNOSIS — Z882 Allergy status to sulfonamides status: Secondary | ICD-10-CM

## 2018-08-12 DIAGNOSIS — Z955 Presence of coronary angioplasty implant and graft: Secondary | ICD-10-CM

## 2018-08-12 DIAGNOSIS — F329 Major depressive disorder, single episode, unspecified: Secondary | ICD-10-CM | POA: Diagnosis present

## 2018-08-12 DIAGNOSIS — Z1159 Encounter for screening for other viral diseases: Secondary | ICD-10-CM | POA: Diagnosis not present

## 2018-08-12 DIAGNOSIS — Z79899 Other long term (current) drug therapy: Secondary | ICD-10-CM

## 2018-08-12 DIAGNOSIS — Z20828 Contact with and (suspected) exposure to other viral communicable diseases: Secondary | ICD-10-CM | POA: Diagnosis not present

## 2018-08-12 DIAGNOSIS — I213 ST elevation (STEMI) myocardial infarction of unspecified site: Secondary | ICD-10-CM | POA: Diagnosis present

## 2018-08-12 DIAGNOSIS — Z9049 Acquired absence of other specified parts of digestive tract: Secondary | ICD-10-CM | POA: Diagnosis not present

## 2018-08-12 DIAGNOSIS — N170 Acute kidney failure with tubular necrosis: Secondary | ICD-10-CM | POA: Diagnosis not present

## 2018-08-12 DIAGNOSIS — I10 Essential (primary) hypertension: Secondary | ICD-10-CM | POA: Diagnosis present

## 2018-08-12 DIAGNOSIS — W19XXXA Unspecified fall, initial encounter: Secondary | ICD-10-CM | POA: Diagnosis not present

## 2018-08-12 DIAGNOSIS — R55 Syncope and collapse: Secondary | ICD-10-CM

## 2018-08-12 DIAGNOSIS — W010XXA Fall on same level from slipping, tripping and stumbling without subsequent striking against object, initial encounter: Secondary | ICD-10-CM | POA: Diagnosis present

## 2018-08-12 DIAGNOSIS — I499 Cardiac arrhythmia, unspecified: Secondary | ICD-10-CM | POA: Diagnosis not present

## 2018-08-12 DIAGNOSIS — I255 Ischemic cardiomyopathy: Secondary | ICD-10-CM | POA: Diagnosis not present

## 2018-08-12 LAB — COMPREHENSIVE METABOLIC PANEL
ALT: 31 U/L (ref 0–44)
AST: 27 U/L (ref 15–41)
Albumin: 4 g/dL (ref 3.5–5.0)
Alkaline Phosphatase: 65 U/L (ref 38–126)
Anion gap: 11 (ref 5–15)
BUN: 14 mg/dL (ref 8–23)
CO2: 20 mmol/L — ABNORMAL LOW (ref 22–32)
Calcium: 9.6 mg/dL (ref 8.9–10.3)
Chloride: 105 mmol/L (ref 98–111)
Creatinine, Ser: 1.37 mg/dL — ABNORMAL HIGH (ref 0.61–1.24)
GFR calc Af Amer: 58 mL/min — ABNORMAL LOW (ref >60)
GFR calc non Af Amer: 50 mL/min — ABNORMAL LOW (ref >60)
Glucose, Bld: 132 mg/dL — ABNORMAL HIGH (ref 70–99)
Potassium: 3.6 mmol/L (ref 3.5–5.1)
Sodium: 136 mmol/L (ref 135–145)
Total Bilirubin: 1 mg/dL (ref 0.3–1.2)
Total Protein: 6.5 g/dL (ref 6.5–8.1)

## 2018-08-12 LAB — CBC WITH DIFFERENTIAL/PLATELET
Abs Immature Granulocytes: 0.02 10*3/uL (ref 0.00–0.07)
Basophils Absolute: 0.1 10*3/uL (ref 0.0–0.1)
Basophils Relative: 1 %
Eosinophils Absolute: 0.1 10*3/uL (ref 0.0–0.5)
Eosinophils Relative: 1 %
HCT: 46.9 % (ref 39.0–52.0)
Hemoglobin: 16.2 g/dL (ref 13.0–17.0)
Immature Granulocytes: 0 %
Lymphocytes Relative: 18 %
Lymphs Abs: 1.4 10*3/uL (ref 0.7–4.0)
MCH: 29.7 pg (ref 26.0–34.0)
MCHC: 34.5 g/dL (ref 30.0–36.0)
MCV: 85.9 fL (ref 80.0–100.0)
Monocytes Absolute: 0.6 10*3/uL (ref 0.1–1.0)
Monocytes Relative: 8 %
Neutro Abs: 5.5 10*3/uL (ref 1.7–7.7)
Neutrophils Relative %: 72 %
Platelets: 156 10*3/uL (ref 150–400)
RBC: 5.46 MIL/uL (ref 4.22–5.81)
RDW: 12.5 % (ref 11.5–15.5)
WBC: 7.7 10*3/uL (ref 4.0–10.5)
nRBC: 0 % (ref 0.0–0.2)

## 2018-08-12 LAB — GLUCOSE, CAPILLARY: Glucose-Capillary: 91 mg/dL (ref 70–99)

## 2018-08-12 LAB — SARS CORONAVIRUS 2 BY RT PCR (HOSPITAL ORDER, PERFORMED IN ~~LOC~~ HOSPITAL LAB): SARS Coronavirus 2: NEGATIVE

## 2018-08-12 MED ORDER — ENOXAPARIN SODIUM 40 MG/0.4ML ~~LOC~~ SOLN
40.0000 mg | SUBCUTANEOUS | Status: DC
  Administered 2018-08-12 – 2018-08-18 (×7): 40 mg via SUBCUTANEOUS
  Filled 2018-08-12 (×7): qty 0.4

## 2018-08-12 MED ORDER — ALPRAZOLAM 0.5 MG PO TABS
0.5000 mg | ORAL_TABLET | Freq: Every day | ORAL | Status: DC | PRN
  Administered 2018-08-14 – 2018-08-18 (×3): 0.5 mg via ORAL
  Filled 2018-08-12 (×3): qty 1

## 2018-08-12 MED ORDER — ACETAMINOPHEN 325 MG PO TABS
650.0000 mg | ORAL_TABLET | Freq: Four times a day (QID) | ORAL | Status: DC | PRN
  Administered 2018-08-14 – 2018-08-16 (×2): 650 mg via ORAL
  Filled 2018-08-12 (×2): qty 2

## 2018-08-12 MED ORDER — ESCITALOPRAM OXALATE 10 MG PO TABS
10.0000 mg | ORAL_TABLET | Freq: Every day | ORAL | Status: DC
  Administered 2018-08-13 – 2018-08-19 (×7): 10 mg via ORAL
  Filled 2018-08-12 (×7): qty 1

## 2018-08-12 MED ORDER — TICAGRELOR 90 MG PO TABS
90.0000 mg | ORAL_TABLET | Freq: Two times a day (BID) | ORAL | Status: DC
  Administered 2018-08-12 – 2018-08-19 (×14): 90 mg via ORAL
  Filled 2018-08-12 (×14): qty 1

## 2018-08-12 MED ORDER — ROSUVASTATIN CALCIUM 20 MG PO TABS
40.0000 mg | ORAL_TABLET | Freq: Every evening | ORAL | Status: DC
  Administered 2018-08-12 – 2018-08-18 (×7): 40 mg via ORAL
  Filled 2018-08-12 (×7): qty 2

## 2018-08-12 MED ORDER — ONDANSETRON HCL 4 MG PO TABS: 4.0000 mg | ORAL_TABLET | Freq: Four times a day (QID) | ORAL | Status: DC | PRN

## 2018-08-12 MED ORDER — ASPIRIN 81 MG PO CHEW
81.0000 mg | CHEWABLE_TABLET | Freq: Every day | ORAL | Status: DC
  Administered 2018-08-13 – 2018-08-19 (×7): 81 mg via ORAL
  Filled 2018-08-12 (×7): qty 1

## 2018-08-12 MED ORDER — ACETAMINOPHEN 650 MG RE SUPP: 650.0000 mg | Freq: Four times a day (QID) | RECTAL | Status: DC | PRN

## 2018-08-12 MED ORDER — LOSARTAN POTASSIUM 25 MG PO TABS: 12.5000 mg | ORAL_TABLET | Freq: Every day | ORAL | Status: DC

## 2018-08-12 MED ORDER — NITROGLYCERIN 0.4 MG SL SUBL: 0.4000 mg | SUBLINGUAL_TABLET | SUBLINGUAL | Status: DC | PRN

## 2018-08-12 MED ORDER — SPIRONOLACTONE 25 MG PO TABS
12.5000 mg | ORAL_TABLET | Freq: Every day | ORAL | Status: DC
  Filled 2018-08-12: qty 0.5

## 2018-08-12 MED ORDER — ONDANSETRON HCL 4 MG/2ML IJ SOLN: 4.0000 mg | Freq: Four times a day (QID) | INTRAMUSCULAR | Status: DC | PRN

## 2018-08-12 MED ORDER — CARVEDILOL 3.125 MG PO TABS
3.1250 mg | ORAL_TABLET | Freq: Two times a day (BID) | ORAL | Status: DC
  Filled 2018-08-12 (×2): qty 1

## 2018-08-12 MED ORDER — PANTOPRAZOLE SODIUM 40 MG PO TBEC
40.0000 mg | DELAYED_RELEASE_TABLET | Freq: Every day | ORAL | Status: DC
  Administered 2018-08-13 – 2018-08-19 (×7): 40 mg via ORAL
  Filled 2018-08-12 (×7): qty 1

## 2018-08-12 MED ORDER — ARIPIPRAZOLE 5 MG PO TABS
5.0000 mg | ORAL_TABLET | Freq: Every day | ORAL | Status: DC
  Administered 2018-08-13 – 2018-08-19 (×7): 5 mg via ORAL
  Filled 2018-08-12 (×7): qty 1

## 2018-08-12 NOTE — Progress Notes (Signed)
Received report from [place]. Room ready for patient. Jennaya Pogue, Wonda Cheng, Therapist, sports

## 2018-08-12 NOTE — H&P (Signed)
History and Physical    Austin Mora:[id number] DOB: 06/26/1945 DOA: 08/12/2018  PCP: Leanna Battles, MD  Patient coming from:      Chief Complaint: Fall.  HPI: Austin Mora is a 73 y.o. male with history of recent ST elevation MI status post stent placement, ischemic cardiomyopathy was brought to the ER after patient had a fall at the living facility.  Per report patient had some argument with family and was turning back and suddenly felt.  It is not clear if patient lost consciousness but per family patient has poor memory and history is not very reliable.  EMS was called and patient blood pressure initially was found to be 419 systolic.  Patient denies any dizziness prior to the fall any chest pain shortness of breath nausea vomiting or diarrhea.  ED Course: In the ER patient blood pressure was more than 622 systolic with the labs showing increased creatinine of 1.37 from baseline of 1.02 last week.  EKG was showing no new changes with old T wave inversion in anterolateral leads.  Patient did not have any chest pain CT it is unremarkable COVID-19 test was negative.  Later when checking orthostatic changes patient was clearly orthostatic blood pressure dropping to 60s on standing.  Patient admitted for possible syncope fall and orthostatic changes.  Review of Systems: As per HPI, rest all negative.   Past Medical History:  Diagnosis Date   Depression    Diabetes mellitus    type 2   GERD (gastroesophageal reflux disease)    Hyperlipidemia    Hypertension    Memory disorder 07/01/2018   STEMI (ST elevation myocardial infarction) ([place])    07/31/18 PCI/DES x1 to LAD, EF 30-35%    Past Surgical History:  Procedure Laterality Date   [place]   CORONARY STENT INTERVENTION N/A 07/28/2018   Procedure: CORONARY STENT INTERVENTION;  Surgeon: Troy Sine, MD;  Location: [facility];  Service: Cardiovascular;  Laterality: N/A;    CORONARY/GRAFT ACUTE MI REVASCULARIZATION N/A 07/28/2018   Procedure: Coronary/Graft Acute MI Revascularization;  Surgeon: Troy Sine, MD;  Location: [facility];  Service: Cardiovascular;  Laterality: N/A;   [place] CATH AND CORONARY ANGIOGRAPHY N/A 07/28/2018   Procedure: LEFT HEART CATH AND CORONARY ANGIOGRAPHY;  Surgeon: Troy Sine, MD;  Location: [facility];  Service: Cardiovascular;  Laterality: N/A;   SHOULDER ARTHROSCOPY W/ ROTATOR CUFF REPAIR  2010   left     reports that he has been smoking. He has been smoking about 0.50 packs per day. He has never used smokeless tobacco. He reports that he does not drink alcohol or use drugs.  Allergies  Allergen Reactions   Aricept [Donepezil Hcl]     Vivid dreams   Sulfa Antibiotics Hives    Family History  Problem Relation Age of Onset   Heart attack Father    Heart attack Brother     Prior to Admission medications   Medication Sig Start Date End Date Taking? Authorizing Provider  ALPRAZolam Duanne Moron) 0.5 MG tablet Take 0.5 mg by mouth daily as needed for anxiety (or agitation).   Yes [provider]  ARIPiprazole (ABILIFY) 5 MG tablet Take 5 mg by mouth daily.  05/05/18  Yes [provider]  carvedilol (COREG) 3.125 MG tablet Take 1 tablet (3.125 mg total) by mouth 2 (two) times daily with a meal. 08/05/18  Yes Reino Bellis B, NP  escitalopram (LEXAPRO) 10 MG tablet Take 10 mg by mouth daily. 05/08/18  Yes [provider]  losartan (COZAAR) 25 MG tablet Take 0.5 tablets (12.5 mg total) by mouth daily. 08/05/18  Yes Reino Bellis B, NP  Nicotine (NICODERM CQ TD) Place 1 patch onto the skin daily as needed.   Yes [provider]  omeprazole (PRILOSEC) 40 MG capsule Take 40 mg by mouth daily before breakfast.  04/30/11  Yes [provider]  rosuvastatin (CRESTOR) 40 MG tablet Take 1 tablet (40 mg total) by mouth daily at 6 PM. Patient  taking differently: Take 40 mg by mouth every evening.  08/05/18  Yes Reino Bellis B, NP  spironolactone (ALDACTONE) 25 MG tablet Take 0.5 tablets (12.5 mg total) by mouth daily. 08/05/18  Yes Cheryln Manly, NP  ticagrelor (BRILINTA) 90 MG TABS tablet Take 1 tablet (90 mg total) by mouth 2 (two) times daily. 08/05/18  Yes Reino Bellis B, NP  aspirin 81 MG tablet Take 81 mg by mouth daily.    [provider]  nitroGLYCERIN (NITROSTAT) 0.4 MG SL tablet Place 1 tablet (0.4 mg total) under the tongue every 5 (five) minutes as needed. 08/05/18   Cheryln Manly, NP    Physical Exam: Constitutional: Moderately built and nourished. Vitals:   08/12/18 1802 08/12/18 1845 08/12/18 1930 08/12/18 1945  BP:      Pulse:  61 (!) 58 60  Resp:  17 14 18   Temp: 98.1 F (36.7 C)     TempSrc: Oral     SpO2:  98% 100% 99%   Eyes: Anicteric no pallor. ENMT: No discharge from the ears eyes nose or mouth. Neck: No mass or.  No neck rigidity. Respiratory: No rhonchi or crepitations. Cardiovascular: S1-S2 heard. Abdomen: Soft nontender bowel sounds present. Musculoskeletal: No edema. Skin: No rash. Neurologic: Alert awake oriented to place and person.  Moves all extremities. Psychiatric: Oriented to place and person.   Labs on Admission: I have personally reviewed following labs and imaging studies  CBC: Recent Labs  Lab 08/12/18 1713  WBC 7.7  NEUTROABS 5.5  HGB 16.2  HCT 46.9  MCV 85.9  PLT 846   Basic Metabolic Panel: Recent Labs  Lab 08/12/18 1713  NA 136  K 3.6  CL 105  CO2 20*  GLUCOSE 132*  BUN 14  CREATININE 1.37*  CALCIUM 9.6   GFR: Estimated Creatinine Clearance: 47.3 mL/min (A) (by C-G formula based on SCr of 1.37 mg/dL (H)). Liver Function Tests: Recent Labs  Lab 08/12/18 1713  AST 27  ALT 31  ALKPHOS 65  BILITOT 1.0  PROT 6.5  ALBUMIN 4.0   No results for input(s): LIPASE, AMYLASE in the last 168 hours. No results for input(s): AMMONIA in the  last 168 hours. Coagulation Profile: No results for input(s): INR, PROTIME in the last 168 hours. Cardiac Enzymes: No results for input(s): CKTOTAL, CKMB, CKMBINDEX, TROPONINI in the last 168 hours. BNP (last 3 results) No results for input(s): PROBNP in the last 8760 hours. HbA1C: No results for input(s): HGBA1C in the last 72 hours. CBG: No results for input(s): GLUCAP in the last 168 hours. Lipid Profile: No results for input(s): CHOL, HDL, LDLCALC, TRIG, CHOLHDL, LDLDIRECT in the last 72 hours. Thyroid Function Tests: No results for input(s): TSH, T4TOTAL, FREET4, T3FREE, THYROIDAB in the last 72 hours. Anemia Panel: No results for input(s): VITAMINB12, FOLATE, FERRITIN, TIBC, IRON, RETICCTPCT in the last 72 hours.  Urine analysis: No results found for: COLORURINE, APPEARANCEUR, LABSPEC, PHURINE, GLUCOSEU, HGBUR, BILIRUBINUR, KETONESUR, PROTEINUR, UROBILINOGEN, NITRITE, LEUKOCYTESUR Sepsis Labs: @LABRCNTIP (procalcitonin:4,lacticidven:4) ) Recent Results (from the past 240 hour(s))  SARS Coronavirus 2 (CEPHEID - Performed in [place] hospital lab), Hosp Order     Status: None   Collection Time: 08/04/18 11:18 AM   Specimen: Nasopharyngeal Swab  Result Value Ref Range Status   SARS Coronavirus 2 NEGATIVE NEGATIVE Final    Comment: (NOTE) If result is NEGATIVE SARS-CoV-2 target nucleic acids are NOT DETECTED. The SARS-CoV-2 RNA is generally detectable in upper and lower  respiratory specimens during the acute phase of infection. The lowest  concentration of SARS-CoV-2 viral copies this assay can detect is 250  copies / mL. A negative result does not preclude SARS-CoV-2 infection  and should not be used as the sole basis for treatment or other  patient management decisions.  A negative result may occur with  improper specimen collection / handling, submission of specimen other  than nasopharyngeal swab, presence of viral mutation(s) within the  areas targeted by this assay,  and inadequate number of viral copies  (<250 copies / mL). A negative result must be combined with clinical  observations, patient history, and epidemiological information. If result is POSITIVE SARS-CoV-2 target nucleic acids are DETECTED. The SARS-CoV-2 RNA is generally detectable in upper and lower  respiratory specimens dur ing the acute phase of infection.  Positive  results are indicative of active infection with SARS-CoV-2.  Clinical  correlation with patient history and other diagnostic information is  necessary to determine patient infection status.  Positive results do  not rule out bacterial infection or co-infection with other viruses. If result is PRESUMPTIVE POSTIVE SARS-CoV-2 nucleic acids MAY BE PRESENT.   A presumptive positive result was obtained on the submitted specimen  and confirmed on repeat testing.  While 2019 novel coronavirus  (SARS-CoV-2) nucleic acids may be present in the submitted sample  additional confirmatory testing may be necessary for epidemiological  and / or clinical management purposes  to differentiate between  SARS-CoV-2 and other Sarbecovirus currently known to infect humans.  If clinically indicated additional testing with an alternate test  methodology [phone]) is advised. The SARS-CoV-2 RNA is generally  detectable in upper and lower respiratory sp ecimens during the acute  phase of infection. The expected result is Negative. Fact Sheet for Patients:  StrictlyIdeas.no Fact Sheet for Healthcare Providers: BankingDealers.co.za This test is not yet approved or cleared by the [place] FDA and has been authorized for detection and/or diagnosis of SARS-CoV-2 by FDA under an Emergency Use Authorization (EUA).  This EUA will remain in effect (meaning this test can be used) for the duration of the COVID-19 declaration under Section 564(b)(1) of the Act, 21 U.S.C. section 360bbb-3(b)(1), unless  the authorization is terminated or revoked sooner. Performed at [facility], [place] [address] [place],  [address]   SARS Coronavirus 2 (CEPHEID - Performed in [place] hospital lab), Hosp Order     Status: None   Collection Time: 08/12/18  7:21 PM   Specimen: Nasopharyngeal Swab  Result Value Ref Range Status   SARS Coronavirus 2 NEGATIVE NEGATIVE Final    Comment: (NOTE) If result is NEGATIVE SARS-CoV-2 target nucleic acids are NOT DETECTED. The SARS-CoV-2 RNA is generally detectable in upper and lower  respiratory specimens during the acute phase of infection. The lowest  concentration of SARS-CoV-2 viral copies this assay can detect is 250  copies /  mL. A negative result does not preclude SARS-CoV-2 infection  and should not be used as the sole basis for treatment or other  patient management decisions.  A negative result may occur with  improper specimen collection / handling, submission of specimen other  than nasopharyngeal swab, presence of viral mutation(s) within the  areas targeted by this assay, and inadequate number of viral copies  (<250 copies / mL). A negative result must be combined with clinical  observations, patient history, and epidemiological information. If result is POSITIVE SARS-CoV-2 target nucleic acids are DETECTED. The SARS-CoV-2 RNA is generally detectable in upper and lower  respiratory specimens dur ing the acute phase of infection.  Positive  results are indicative of active infection with SARS-CoV-2.  Clinical  correlation with patient history and other diagnostic information is  necessary to determine patient infection status.  Positive results do  not rule out bacterial infection or co-infection with other viruses. If result is PRESUMPTIVE POSTIVE SARS-CoV-2 nucleic acids MAY BE PRESENT.   A presumptive positive result was obtained on the submitted specimen  and confirmed on repeat testing.  While 2019 novel coronavirus    (SARS-CoV-2) nucleic acids may be present in the submitted sample  additional confirmatory testing may be necessary for epidemiological  and / or clinical management purposes  to differentiate between  SARS-CoV-2 and other Sarbecovirus currently known to infect humans.  If clinically indicated additional testing with an alternate test  methodology [phone]) is advised. The SARS-CoV-2 RNA is generally  detectable in upper and lower respiratory sp ecimens during the acute  phase of infection. The expected result is Negative. Fact Sheet for Patients:  StrictlyIdeas.no Fact Sheet for Healthcare Providers: BankingDealers.co.za This test is not yet approved or cleared by the [place] FDA and has been authorized for detection and/or diagnosis of SARS-CoV-2 by FDA under an Emergency Use Authorization (EUA).  This EUA will remain in effect (meaning this test can be used) for the duration of the COVID-19 declaration under Section 564(b)(1) of the Act, 21 U.S.C. section 360bbb-3(b)(1), unless the authorization is terminated or revoked sooner. Performed at [facility], [place] [address] [place], [place] [address]      Radiological Exams on Admission: Ct Head Wo Contrast  Result Date: 08/12/2018 CLINICAL DATA:  Head trauma, headache. Fall, question syncope. EXAM: CT HEAD WITHOUT CONTRAST TECHNIQUE: Contiguous axial images were obtained from the base of the skull through the vertex without intravenous contrast. COMPARISON:  Right MRI 01/19/2018 FINDINGS: Brain: No intracranial hemorrhage, mass effect, or midline shift. No hydrocephalus. Brain volume is normal for age. Minor chronic small vessel ischemia. Remote lacunar infarct versus prominent perivascular space in the right insula. The basilar cisterns are patent. No evidence of territorial infarct or acute ischemia. No extra-axial or intracranial fluid collection. Vascular: No hyperdense  vessel. Skull base atherosclerosis. Skull: No fracture or focal lesion. Sinuses/Orbits: Paranasal sinuses and mastoid air cells are clear. The visualized orbits are unremarkable. Other: None. IMPRESSION: No acute intracranial abnormality. Electronically Signed   By: Keith Rake M.D.   On: 08/12/2018 19:24    EKG: Independently reviewed.  Normal sinus rhythm with marked T wave changes comparable recent EKG.  Assessment/Plan Principal Problem:   Syncope Active Problems:   Memory disorder   Parkinson's disease ([place])   Ischemic cardiomyopathy   ARF (acute renal failure) ([place])    1. Orthostatic hypotension/fall possible syncope -rule out a final cc normal saline bolus and closely monitor in telemetry.  Recheck orthostatics  in the morning.  Since there is some concern for possible syncope will consult cardiology given the recent MI and EF of 30 to 35%.  Check cortisol levels. 2. Acute renal failure -denies any nausea vomiting or diarrhea.  Was recently started on ARB.  Will hold ARB due to orthostatic changes and increased creatinine.  Have ordered find normal saline bolus. 3. Ischemic cardiomyopathy recent EF measured was 30 to 35%.  Was started on spironolactone ARB and carvedilol.  Holding ARB due to renal failure and orthostatic changes. 4. Recent ST elevation MI status post stent placement on aspirin Brilinta Coreg Lipitor. 5. Memory issues.   DVT prophylaxis: Lovenox. Code Status: Full code. Family Communication: We will need to discuss with family. Disposition Plan: To be determined. Consults called: Cardiology. Admission status: Observation.   Rise Patience MD [facility] Pager [phone].  If 7PM-7AM, please contact night-coverage [email or web address] Password [email or web address]  08/12/2018, 8:59 PM

## 2018-08-12 NOTE — ED Notes (Signed)
Patient transported to CT 

## 2018-08-12 NOTE — ED Provider Notes (Signed)
[facility] EMERGENCY DEPARTMENT Provider Note   CSN: [id number] Arrival date & time: 08/12/18  1650    History   Chief Complaint Chief Complaint  Patient presents with  . Loss of Consciousness    HPI Austin Mora is a 73 y.o. male.     HPI   73yo male with history below including recent STEMI with admission 6/23 presents with concern for unwitnessed fall at home.  Patient reports he just returned home from rehab and was in an argument with his daughter and wife about getting cigarettes. Reports they wanted him to stop cold [place] and he disagreed and wanted cigarettes today.  He left the home after this argument and went outside to get cigarettes and he reports that he tripped and fell down---however, his daughter notes that he has memory issues and is not sure if he is a reliable historian. When EMS arrived, his blood pressure was in the 47S systolic, and quickly improved prior to arrival to the ED.  Patient denies having any symptoms today. Denies chest pain, dyspnea, neck pain.    Past Medical History:  Diagnosis Date  . Depression   . Diabetes mellitus    type 2  . GERD (gastroesophageal reflux disease)   . Hyperlipidemia   . Hypertension   . Memory disorder 07/01/2018  . STEMI (ST elevation myocardial infarction) ([place])    07/31/18 PCI/DES x1 to LAD, EF 30-35%    Patient Active Problem List   Diagnosis Date Noted  . Syncope 08/12/2018  . ARF (acute renal failure) ([place]) 08/12/2018  . Ischemic cardiomyopathy 08/05/2018  . Hyperlipidemia 08/05/2018  . Acute ST elevation myocardial infarction (STEMI) involving left anterior descending (LAD) coronary artery ([place]) 07/28/2018  . ST elevation myocardial infarction involving left anterior descending (LAD) coronary artery ([place])   . 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 Parkinson's disease ([place]) 07/16/2018  . Memory disorder 07/01/2018    Past Surgical History:  Procedure Laterality Date   . APPENDECTOMY  1960  . CHOLECYSTECTOMY  1995  . CORONARY STENT INTERVENTION N/A 07/28/2018   Procedure: CORONARY STENT INTERVENTION;  Surgeon: Troy Sine, MD;  Location: [facility];  Service: Cardiovascular;  Laterality: N/A;  . CORONARY/GRAFT ACUTE MI REVASCULARIZATION N/A 07/28/2018   Procedure: Coronary/Graft Acute MI Revascularization;  Surgeon: Troy Sine, MD;  Location: [facility];  Service: Cardiovascular;  Laterality: N/A;  . HEMORROIDECTOMY  1998  . LEFT HEART CATH AND CORONARY ANGIOGRAPHY N/A 07/28/2018   Procedure: LEFT HEART CATH AND CORONARY ANGIOGRAPHY;  Surgeon: Troy Sine, MD;  Location: [facility];  Service: Cardiovascular;  Laterality: N/A;  . SHOULDER ARTHROSCOPY W/ ROTATOR CUFF REPAIR  2010   left        Home Medications    Prior to Admission medications   Medication Sig Start Date End Date Taking? Authorizing Provider  ALPRAZolam Duanne Moron) 0.5 MG tablet Take 0.5 mg by mouth daily as needed for anxiety (or agitation).   Yes [provider]  ARIPiprazole (ABILIFY) 5 MG tablet Take 5 mg by mouth daily.  05/05/18  Yes [provider]  carvedilol (COREG) 3.125 MG tablet Take 1 tablet (3.125 mg total) by mouth 2 (two) times daily with a meal. 08/05/18  Yes Reino Bellis B, NP  escitalopram (LEXAPRO) 10 MG tablet Take 10 mg by mouth daily. 05/08/18  Yes [provider]  losartan (COZAAR) 25 MG tablet Take 0.5 tablets (12.5 mg total) by  mouth daily. 08/05/18  Yes Reino Bellis B, NP  Nicotine (NICODERM CQ TD) Place 1 patch onto the skin daily as needed.   Yes [provider]  omeprazole (PRILOSEC) 40 MG capsule Take 40 mg by mouth daily before breakfast.  04/30/11  Yes [provider]  rosuvastatin (CRESTOR) 40 MG tablet Take 1 tablet (40 mg total) by mouth daily at 6 PM. Patient taking differently: Take 40 mg by mouth every evening.  08/05/18  Yes Reino Bellis B, NP  spironolactone (ALDACTONE)  25 MG tablet Take 0.5 tablets (12.5 mg total) by mouth daily. 08/05/18  Yes Cheryln Manly, NP  ticagrelor (BRILINTA) 90 MG TABS tablet Take 1 tablet (90 mg total) by mouth 2 (two) times daily. 08/05/18  Yes Reino Bellis B, NP  aspirin 81 MG tablet Take 81 mg by mouth daily.    [provider]  nitroGLYCERIN (NITROSTAT) 0.4 MG SL tablet Place 1 tablet (0.4 mg total) under the tongue every 5 (five) minutes as needed. 08/05/18   Cheryln Manly, NP    Family History Family History  Problem Relation Age of Onset  . Heart attack Father   . Heart attack Brother     Social History Social History   Tobacco Use  . Smoking status: Current Every Day Smoker    Packs/day: 0.50  . Smokeless tobacco: Never Used  Substance Use Topics  . Alcohol use: No  . Drug use: No     Allergies   Aricept [donepezil hcl] and Sulfa antibiotics   Review of Systems Review of Systems  Constitutional: Negative for fever.  HENT: Negative for sore throat.   Eyes: Negative for visual disturbance.  Respiratory: Negative for shortness of breath.   Cardiovascular: Negative for chest pain.  Gastrointestinal: Negative for abdominal pain, nausea and vomiting.  Genitourinary: Negative for difficulty urinating.  Musculoskeletal: Negative for back pain and neck stiffness.  Skin: Negative for rash.  Neurological: Positive for syncope (suspected). Negative for weakness, light-headedness and headaches.     Physical Exam Updated Vital Signs BP (!) 85/57 (BP Location: Left Arm)   Pulse 70   Temp (!) 97.5 F (36.4 C) (Oral)   Resp 18   Ht 5\' 9"  (1.753 m)   Wt 72.7 kg   SpO2 97%   BMI 23.67 kg/m   Physical Exam Vitals signs and nursing note reviewed.  Constitutional:      General: He is not in acute distress.    Appearance: He is well-developed. He is not diaphoretic.  HENT:     Head: Normocephalic and atraumatic.  Eyes:     Conjunctiva/sclera: Conjunctivae normal.  Neck:      Musculoskeletal: Normal range of motion.  Cardiovascular:     Rate and Rhythm: Normal rate and regular rhythm.     Heart sounds: Normal heart sounds. No murmur. No friction rub. No gallop.   Pulmonary:     Effort: Pulmonary effort is normal. No respiratory distress.     Breath sounds: Normal breath sounds. No wheezing or rales.  Abdominal:     General: There is no distension.     Palpations: Abdomen is soft.     Tenderness: There is no abdominal tenderness. There is no guarding.  Skin:    General: Skin is warm and dry.  Neurological:     Mental Status: He is alert and oriented to person, place, and time.      ED Treatments / Results  Labs (all labs ordered are listed,  but only abnormal results are displayed) Labs Reviewed  COMPREHENSIVE METABOLIC PANEL - Abnormal; Notable for the following components:      Result Value   CO2 20 (*)    Glucose, Bld 132 (*)    Creatinine, Ser 1.37 (*)    GFR calc non Af Amer 50 (*)    GFR calc Af Amer 58 (*)    All other components within normal limits  SARS CORONAVIRUS 2 (HOSPITAL ORDER, [facility])  CBC WITH DIFFERENTIAL/PLATELET  GLUCOSE, CAPILLARY  BASIC METABOLIC PANEL  CBC    EKG None  Similar deep TW inversions as ECG 7/2  Radiology Ct Head Wo Contrast  Result Date: 08/12/2018 CLINICAL DATA:  Head trauma, headache. Fall, question syncope. EXAM: CT HEAD WITHOUT CONTRAST TECHNIQUE: Contiguous axial images were obtained from the base of the skull through the vertex without intravenous contrast. COMPARISON:  Right MRI 01/19/2018 FINDINGS: Brain: No intracranial hemorrhage, mass effect, or midline shift. No hydrocephalus. Brain volume is normal for age. Minor chronic small vessel ischemia. Remote lacunar infarct versus prominent perivascular space in the right insula. The basilar cisterns are patent. No evidence of territorial infarct or acute ischemia. No extra-axial or intracranial fluid collection. Vascular:  No hyperdense vessel. Skull base atherosclerosis. Skull: No fracture or focal lesion. Sinuses/Orbits: Paranasal sinuses and mastoid air cells are clear. The visualized orbits are unremarkable. Other: None. IMPRESSION: No acute intracranial abnormality. Electronically Signed   By: Keith Rake M.D.   On: 08/12/2018 19:24    Procedures Procedures (including critical care time)  Medications Ordered in ED Medications  aspirin chewable tablet 81 mg (has no administration in time range)  carvedilol (COREG) tablet 3.125 mg (has no administration in time range)  losartan (COZAAR) tablet 12.5 mg (has no administration in time range)  nitroGLYCERIN (NITROSTAT) SL tablet 0.4 mg (has no administration in time range)  rosuvastatin (CRESTOR) tablet 40 mg (40 mg Oral Given 08/12/18 2226)  spironolactone (ALDACTONE) tablet 12.5 mg (has no administration in time range)  ALPRAZolam (XANAX) tablet 0.5 mg (has no administration in time range)  ARIPiprazole (ABILIFY) tablet 5 mg (has no administration in time range)  escitalopram (LEXAPRO) tablet 10 mg (has no administration in time range)  pantoprazole (PROTONIX) EC tablet 40 mg (has no administration in time range)  ticagrelor (BRILINTA) tablet 90 mg (90 mg Oral Given 08/12/18 2226)  acetaminophen (TYLENOL) tablet 650 mg (has no administration in time range)    Or  acetaminophen (TYLENOL) suppository 650 mg (has no administration in time range)  ondansetron (ZOFRAN) tablet 4 mg (has no administration in time range)    Or  ondansetron (ZOFRAN) injection 4 mg (has no administration in time range)  enoxaparin (LOVENOX) injection 40 mg (40 mg Subcutaneous Given 08/12/18 2226)     Initial Impression / Assessment and Plan / ED Course  I have reviewed the triage vital signs and the nursing notes.  Pertinent labs & imaging results that were available during my care of the patient were reviewed by me and considered in my medical decision making (see chart for  details).  Clinical Course as of Aug 13 11  Wed Aug 12, 2018  1943 Dr. Hal Hope accepting admission.   [JR]    Clinical Course User Index [JR] [place], [place] N, [place]        73yo male with history below including recent STEMI with admission 6/23 presents with concern for unwitnessed fall at home after leaving rehab today.  Patient reports  mechanical fall but family is concerned with his memory problems that he is not a reliable historian and EMS noted hypotension on their arrival. Given this and recent severe cardiac event, will admit for syncope observation.  Labs without other sig findings. No CP or dyspnea. Will admit for further care.   Final Clinical Impressions(s) / ED Diagnoses   Final diagnoses:  Hypotension, unspecified hypotension type  Syncope, unspecified syncope type    ED Discharge Orders    None       Gareth Morgan, MD 08/13/18 [phone]

## 2018-08-12 NOTE — Progress Notes (Signed)
New Admission Note:   Arrival Method: Arrived from [facility] ED via stretcher Mental Orientation: Alert and oriented to person and place. Telemetry: Box #1 Assessment: Completed Skin: See doc flowsheet IV: NSL- Rt FA Pain: Denies Tubes: N/A Safety Measures: Safety Fall Prevention Plan has been discussed.  Admission: Completed 5MW Orientation: Patient has been orientated to the room, unit and staff.  Family: None at bedside  Orders have been reviewed and implemented. Will continue to monitor the patient. Call light has been placed within reach and bed alarm has been activated.   Norene Oliveri [employer], RN-BC Phone number: [phone]

## 2018-08-12 NOTE — ED Triage Notes (Signed)
Pt arrives ems from home with reports of fall. ? Syncope. Initially 80 palpated with ems. Just discharged from rehab after cardiac stent. Pt is on brilinta.

## 2018-08-12 NOTE — Progress Notes (Addendum)
Orthostatic BP:  Laying- 94/60 HR-59 Sitting-83/63 HR-58 Standing-66/51 HR 66 Standing 3 min- 85/57 HR-70  Will continue to monitor.  Fransico Michael, RN

## 2018-08-12 NOTE — ED Notes (Signed)
ED TO INPATIENT HANDOFF REPORT  ED Nurse Name and Phone #: Advait Buice [phone]  S Name/Age/Gender Austin Mora 73 y.o. male Room/Bed: 062I/948N  Code Status   Code Status: Prior  Home/SNF/Other Home Patient oriented to: self, place, time and situation Is this baseline? Yes   Triage Complete: Triage complete  Chief Complaint Syncopal  Triage Note Pt arrives ems from home with reports of fall. ? Syncope. Initially 80 palpated with ems. Just discharged from rehab after cardiac stent. Pt is on brilinta.    Allergies Allergies  Allergen Reactions  . Aricept [Donepezil Hcl]     Vivid dreams  . Sulfa Antibiotics Hives    Level of Care/Admitting Diagnosis ED Disposition    ED Disposition Condition Comment   Admit  Hospital Area: [facility] [100100]  Level of Care: Telemetry Medical [104]  I expect the patient will be discharged within 24 hours: No (not a candidate for 5C-Observation unit)  Covid Evaluation: Asymptomatic Screening Protocol (No Symptoms)  Diagnosis: Syncope [462703]  Admitting Physician: Rise Patience [phone]  Attending Physician: Rise Patience Lei.Right  PT Class (Do Not Modify): Observation [104]  PT Acc Code (Do Not Modify): Observation [10022]       B Medical/Surgery History Past Medical History:  Diagnosis Date  . Depression   . Diabetes mellitus    type 2  . GERD (gastroesophageal reflux disease)   . Hyperlipidemia   . Hypertension   . Memory disorder 07/01/2018  . STEMI (ST elevation myocardial infarction) ([place])    07/31/18 PCI/DES x1 to LAD, EF 30-35%   Past Surgical History:  Procedure Laterality Date  . APPENDECTOMY  1960  . CHOLECYSTECTOMY  1995  . CORONARY STENT INTERVENTION N/A 07/28/2018   Procedure: CORONARY STENT INTERVENTION;  Surgeon: Troy Sine, MD;  Location: [facility];  Service: Cardiovascular;  Laterality: N/A;  . CORONARY/GRAFT ACUTE MI REVASCULARIZATION N/A 07/28/2018   Procedure:  Coronary/Graft Acute MI Revascularization;  Surgeon: Troy Sine, MD;  Location: [facility];  Service: Cardiovascular;  Laterality: N/A;  . HEMORROIDECTOMY  1998  . LEFT HEART CATH AND CORONARY ANGIOGRAPHY N/A 07/28/2018   Procedure: LEFT HEART CATH AND CORONARY ANGIOGRAPHY;  Surgeon: Troy Sine, MD;  Location: [facility];  Service: Cardiovascular;  Laterality: N/A;  . SHOULDER ARTHROSCOPY W/ ROTATOR CUFF REPAIR  2010   left     A IV Location/Drains/Wounds Patient Lines/Drains/Airways Status   Active Line/Drains/Airways    Name:   Placement date:   Placement time:   Site:   Days:   Peripheral IV 07/28/18 Left Forearm   07/28/18    -    Forearm   15   Peripheral IV 08/12/18 Forearm   08/12/18    1718    Forearm   less than 1          Intake/Output Last 24 hours No intake or output data in the 24 hours ending 08/12/18 2003  Labs/Imaging Results for orders placed or performed during the hospital encounter of 08/12/18 (from the past 48 hour(s))  CBC with Differential     Status: None   Collection Time: 08/12/18  5:13 PM  Result Value Ref Range   WBC 7.7 4.0 - 10.5 K/uL   RBC 5.46 4.22 - 5.81 MIL/uL   Hemoglobin 16.2 13.0 - 17.0 g/dL   HCT 46.9 39.0 - 52.0 %   MCV 85.9 80.0 - 100.0 fL   MCH 29.7 26.0 -  34.0 pg   MCHC 34.5 30.0 - 36.0 g/dL   RDW 12.5 11.5 - 15.5 %   Platelets 156 150 - 400 K/uL   nRBC 0.0 0.0 - 0.2 %   Neutrophils Relative % 72 %   Neutro Abs 5.5 1.7 - 7.7 K/uL   Lymphocytes Relative 18 %   Lymphs Abs 1.4 0.7 - 4.0 K/uL   Monocytes Relative 8 %   Monocytes Absolute 0.6 0.1 - 1.0 K/uL   Eosinophils Relative 1 %   Eosinophils Absolute 0.1 0.0 - 0.5 K/uL   Basophils Relative 1 %   Basophils Absolute 0.1 0.0 - 0.1 K/uL   Immature Granulocytes 0 %   Abs Immature Granulocytes 0.02 0.00 - 0.07 K/uL    Comment: Performed at [facility] [address], [place], [place] [address]  Comprehensive metabolic panel     Status: Abnormal    Collection Time: 08/12/18  5:13 PM  Result Value Ref Range   Sodium 136 135 - 145 mmol/L   Potassium 3.6 3.5 - 5.1 mmol/L   Chloride 105 98 - 111 mmol/L   CO2 20 (L) 22 - 32 mmol/L   Glucose, Bld 132 (H) 70 - 99 mg/dL   BUN 14 8 - 23 mg/dL   Creatinine, Ser 1.37 (H) 0.61 - 1.24 mg/dL   Calcium 9.6 8.9 - 10.3 mg/dL   Total Protein 6.5 6.5 - 8.1 g/dL   Albumin 4.0 3.5 - 5.0 g/dL   AST 27 15 - 41 U/L   ALT 31 0 - 44 U/L   Alkaline Phosphatase 65 38 - 126 U/L   Total Bilirubin 1.0 0.3 - 1.2 mg/dL   GFR calc non Af Amer 50 (L) >60 mL/min   GFR calc Af Amer 58 (L) >60 mL/min   Anion gap 11 5 - 15    Comment: Performed at [facility] [address] [place], [place] [address]   Ct Head Wo Contrast  Result Date: 08/12/2018 CLINICAL DATA:  Head trauma, headache. Fall, question syncope. EXAM: CT HEAD WITHOUT CONTRAST TECHNIQUE: Contiguous axial images were obtained from the base of the skull through the vertex without intravenous contrast. COMPARISON:  Right MRI 01/19/2018 FINDINGS: Brain: No intracranial hemorrhage, mass effect, or midline shift. No hydrocephalus. Brain volume is normal for age. Minor chronic small vessel ischemia. Remote lacunar infarct versus prominent perivascular space in the right insula. The basilar cisterns are patent. No evidence of territorial infarct or acute ischemia. No extra-axial or intracranial fluid collection. Vascular: No hyperdense vessel. Skull base atherosclerosis. Skull: No fracture or focal lesion. Sinuses/Orbits: Paranasal sinuses and mastoid air cells are clear. The visualized orbits are unremarkable. Other: None. IMPRESSION: No acute intracranial abnormality. Electronically Signed   By: Keith Rake M.D.   On: 08/12/2018 19:24    Pending Labs Unresulted Labs (From admission, onward)    Start     Ordered   08/12/18 1812  SARS Coronavirus 2 (CEPHEID - Performed in [place] [facility] hospital lab), Hosp Order  (Asymptomatic Patients Labs)  Once,   STAT     Question:  Rule Out  Answer:  Yes   08/12/18 1811          Vitals/Pain Today's Vitals   08/12/18 1802 08/12/18 1845 08/12/18 1930 08/12/18 1945  BP:      Pulse:  61 (!) 58 60  Resp:  17 14 18   Temp: 98.1 F (36.7 C)     TempSrc: Oral  SpO2:  98% 100% 99%  PainSc:        Isolation Precautions No active isolations  Medications Medications - No data to display  Mobility walks with person assist Low fall risk   Focused Assessments Cardiac Assessment Handoff:    No results found for: CKTOTAL, CKMB, CKMBINDEX, TROPONINI No results found for: DDIMER Does the Patient currently have chest pain? No      R Recommendations: See Admitting Provider Note  Report given to:   Additional Notes: pt with hx dementia, mostly alert and oriented. Hx afib, currently afib on the monitor. 108/63, HR 71 afib, 96% on RA. Need +1 assist with ambulation.

## 2018-08-13 DIAGNOSIS — R413 Other amnesia: Secondary | ICD-10-CM

## 2018-08-13 DIAGNOSIS — G20 Parkinson's disease: Secondary | ICD-10-CM | POA: Diagnosis present

## 2018-08-13 DIAGNOSIS — Z9049 Acquired absence of other specified parts of digestive tract: Secondary | ICD-10-CM | POA: Diagnosis not present

## 2018-08-13 DIAGNOSIS — Z8249 Family history of ischemic heart disease and other diseases of the circulatory system: Secondary | ICD-10-CM | POA: Diagnosis not present

## 2018-08-13 DIAGNOSIS — I951 Orthostatic hypotension: Secondary | ICD-10-CM | POA: Diagnosis present

## 2018-08-13 DIAGNOSIS — F329 Major depressive disorder, single episode, unspecified: Secondary | ICD-10-CM | POA: Diagnosis present

## 2018-08-13 DIAGNOSIS — E785 Hyperlipidemia, unspecified: Secondary | ICD-10-CM | POA: Diagnosis present

## 2018-08-13 DIAGNOSIS — Z955 Presence of coronary angioplasty implant and graft: Secondary | ICD-10-CM | POA: Diagnosis not present

## 2018-08-13 DIAGNOSIS — I959 Hypotension, unspecified: Secondary | ICD-10-CM

## 2018-08-13 DIAGNOSIS — I213 ST elevation (STEMI) myocardial infarction of unspecified site: Secondary | ICD-10-CM | POA: Diagnosis present

## 2018-08-13 DIAGNOSIS — Z1159 Encounter for screening for other viral diseases: Secondary | ICD-10-CM | POA: Diagnosis not present

## 2018-08-13 DIAGNOSIS — R55 Syncope and collapse: Secondary | ICD-10-CM | POA: Diagnosis not present

## 2018-08-13 DIAGNOSIS — I255 Ischemic cardiomyopathy: Secondary | ICD-10-CM

## 2018-08-13 DIAGNOSIS — N170 Acute kidney failure with tubular necrosis: Secondary | ICD-10-CM

## 2018-08-13 DIAGNOSIS — W010XXA Fall on same level from slipping, tripping and stumbling without subsequent striking against object, initial encounter: Secondary | ICD-10-CM | POA: Diagnosis present

## 2018-08-13 DIAGNOSIS — K219 Gastro-esophageal reflux disease without esophagitis: Secondary | ICD-10-CM | POA: Diagnosis present

## 2018-08-13 DIAGNOSIS — Z888 Allergy status to other drugs, medicaments and biological substances status: Secondary | ICD-10-CM | POA: Diagnosis not present

## 2018-08-13 DIAGNOSIS — I10 Essential (primary) hypertension: Secondary | ICD-10-CM | POA: Diagnosis present

## 2018-08-13 DIAGNOSIS — Z882 Allergy status to sulfonamides status: Secondary | ICD-10-CM | POA: Diagnosis not present

## 2018-08-13 DIAGNOSIS — F17210 Nicotine dependence, cigarettes, uncomplicated: Secondary | ICD-10-CM | POA: Diagnosis present

## 2018-08-13 DIAGNOSIS — I2510 Atherosclerotic heart disease of native coronary artery without angina pectoris: Secondary | ICD-10-CM | POA: Diagnosis present

## 2018-08-13 DIAGNOSIS — Z79899 Other long term (current) drug therapy: Secondary | ICD-10-CM | POA: Diagnosis not present

## 2018-08-13 DIAGNOSIS — E119 Type 2 diabetes mellitus without complications: Secondary | ICD-10-CM | POA: Diagnosis present

## 2018-08-13 DIAGNOSIS — Z7902 Long term (current) use of antithrombotics/antiplatelets: Secondary | ICD-10-CM | POA: Diagnosis not present

## 2018-08-13 DIAGNOSIS — Z7982 Long term (current) use of aspirin: Secondary | ICD-10-CM | POA: Diagnosis not present

## 2018-08-13 DIAGNOSIS — N179 Acute kidney failure, unspecified: Secondary | ICD-10-CM | POA: Diagnosis present

## 2018-08-13 LAB — BASIC METABOLIC PANEL
Anion gap: 7 (ref 5–15)
BUN: 12 mg/dL (ref 8–23)
CO2: 25 mmol/L (ref 22–32)
Calcium: 9.3 mg/dL (ref 8.9–10.3)
Chloride: 104 mmol/L (ref 98–111)
Creatinine, Ser: 1.37 mg/dL — ABNORMAL HIGH (ref 0.61–1.24)
GFR calc Af Amer: 58 mL/min — ABNORMAL LOW (ref >60)
GFR calc non Af Amer: 50 mL/min — ABNORMAL LOW (ref >60)
Glucose, Bld: 82 mg/dL (ref 70–99)
Potassium: 3.6 mmol/L (ref 3.5–5.1)
Sodium: 136 mmol/L (ref 135–145)

## 2018-08-13 LAB — CBC
HCT: 45.7 % (ref 39.0–52.0)
Hemoglobin: 15.4 g/dL (ref 13.0–17.0)
MCH: 29.4 pg (ref 26.0–34.0)
MCHC: 33.7 g/dL (ref 30.0–36.0)
MCV: 87.2 fL (ref 80.0–100.0)
Platelets: 133 10*3/uL — ABNORMAL LOW (ref 150–400)
RBC: 5.24 MIL/uL (ref 4.22–5.81)
RDW: 12.6 % (ref 11.5–15.5)
WBC: 8.2 10*3/uL (ref 4.0–10.5)
nRBC: 0 % (ref 0.0–0.2)

## 2018-08-13 LAB — CORTISOL: Cortisol, Plasma: 15.9 ug/dL

## 2018-08-13 LAB — GLUCOSE, CAPILLARY: Glucose-Capillary: 84 mg/dL (ref 70–99)

## 2018-08-13 MED ORDER — SODIUM CHLORIDE 0.9 % IV SOLN
INTRAVENOUS | Status: AC
  Administered 2018-08-13 – 2018-08-14 (×3): via INTRAVENOUS

## 2018-08-13 MED ORDER — SODIUM CHLORIDE 0.9 % IV BOLUS
500.0000 mL | Freq: Once | INTRAVENOUS | Status: AC
  Administered 2018-08-13: 500 mL via INTRAVENOUS

## 2018-08-13 MED ORDER — POTASSIUM CHLORIDE CRYS ER 20 MEQ PO TBCR
40.0000 meq | EXTENDED_RELEASE_TABLET | Freq: Once | ORAL | Status: AC
  Administered 2018-08-13: 40 meq via ORAL
  Filled 2018-08-13: qty 2

## 2018-08-13 NOTE — Progress Notes (Signed)
Spoke with pt daughter and updated on pt. Answered all question.

## 2018-08-13 NOTE — Progress Notes (Signed)
PROGRESS NOTE    Austin Mora  VPX:[id number] DOB: 09-27-1945 DOA: 08/12/2018 PCP: Leanna Battles, MD    Brief Narrative: HPI per Dr. Donnella Bi is a 73 y.o. male with history of recent ST elevation MI status post stent placement, ischemic cardiomyopathy was brought to the ER after patient had a fall at the living facility.  Per report patient had some argument with family and was turning back and suddenly felt.  It is not clear if patient lost consciousness but per family patient has poor memory and history is not very reliable.  EMS was called and patient blood pressure initially was found to be 462 systolic.  Patient denies any dizziness prior to the fall any chest pain shortness of breath nausea vomiting or diarrhea.  ED Course: In the ER patient blood pressure was more than 703 systolic with the labs showing increased creatinine of 1.37 from baseline of 1.02 last week.  EKG was showing no new changes with old T wave inversion in anterolateral leads.  Patient did not have any chest pain CT it is unremarkable COVID-19 test was negative.  Later when checking orthostatic changes patient was clearly orthostatic blood pressure dropping to 60s on standing.  Patient admitted for possible syncope fall and orthostatic changes.  Assessment & Plan:   Principal Problem:   Syncope Active Problems:   Memory disorder   Parkinson's disease ([place])   Ischemic cardiomyopathy   ARF (acute renal failure) (HCC)  1 orthostatic hypotension/fall/probable syncope Patient noted to be significantly orthostatic on admission.  Patient currently on telemetry.  Patient noted to be still orthostatic this morning with orthostasis despite fluid boluses.  Patient with recent non-ST elevated MI with a cardiomyopathy with a EF of 30 to 35% and as such with patient's possible syncope with orthostasis/hypotension concern for possible cardiac etiology.  Patient with no signs or symptoms of infection.  Patient  afebrile.  Patient with a normal white count.  Random cortisol of 15.9.  Patient with no overt bleeding hemoglobin currently stable at 15.4.  Cardiology has been consulted for further evaluation and management.  We will give a 500 cc bolus of normal saline x1 and placed on normal saline 100 cc/h for the next 24 hours.  Will discontinue patient spironolactone.  Patient's ARB on hold.  Follow.  2.  Acute kidney injury Questionable etiology.  Likely secondary to a prerenal azotemia as patient noted to have orthostatic hypotension in the setting of ARB, spironolactone.  Patient's ARB was held on admission.  Patient still orthostatic and as such we will discontinue spironolactone.  Creatinine at 1.37.  Check a UA.  Follow.  3.  Recent STEMI(07/31/2018)/ischemic cardiomyopathy EF 30 to 35% Patient with recent STEMI status post stent placement on aspirin and Brilinta and Coreg and Lipitor.  Patient also noted to be on spironolactone and ARB.  Spironolactone ARB discontinued secondary to acute kidney injury as well as orthostatic hypotension.  Continue aspirin and Brilinta and Coreg and Lipitor.  Cardiology consulted.  Follow.  4.  Memory issues Continue Abilify and Lexapro.  5.  Hyperlipidemia Continue statin.  6.  Hypertension ARB, spironolactone have been discontinued due to problem #1.  Currently on Coreg.  Follow.  7.   DVT prophylaxis: Lovenox Code Status: Full Family Communication: Updated patient.  No family at bedside. Disposition Plan: Back to living facility when clinically improved and medically stable.   Consultants:   Cardiology pending    Procedures:   CT head without contrast  08/12/2018  Antimicrobials:  None   Subjective: Patient laying in bed.  Denies any chest pain or shortness of breath.  Denies any dizziness.  Patient noted to be orthostatic this morning.  Objective: Vitals:   08/13/18 0037 08/13/18 0317 08/13/18 0634 08/13/18 0946  BP: (!) 92/51 (!) 100/58  102/62 109/62  Pulse: 60 (!) 56 (!) 55 (!) 56  Resp:   18 18  Temp:   98 F (36.7 C) (!) 97.4 F (36.3 C)  TempSrc:    Oral  SpO2:   98% 99%  Weight:      Height:       No intake or output data in the 24 hours ending 08/13/18 1106 Filed Weights   08/12/18 2156  Weight: 72.7 kg    Examination:  General exam: Appears calm and comfortable  Respiratory system: Clear to auscultation. Respiratory effort normal. Cardiovascular system: S1 & S2 heard, RRR. No JVD, murmurs, rubs, gallops or clicks. No pedal edema. Gastrointestinal system: Abdomen is nondistended, soft and nontender. No organomegaly or masses felt. Normal bowel sounds heard. Central nervous system: Alert and oriented. No focal neurological deficits. Extremities: Symmetric 5 x 5 power. Skin: No rashes, lesions or ulcers Psychiatry: Judgement and insight appear fair. Mood & affect appropriate.     Data Reviewed: I have personally reviewed following labs and imaging studies  CBC: Recent Labs  Lab 08/12/18 1713 08/13/18 0351  WBC 7.7 8.2  NEUTROABS 5.5  --   HGB 16.2 15.4  HCT 46.9 45.7  MCV 85.9 87.2  PLT 156 093*   Basic Metabolic Panel: Recent Labs  Lab 08/12/18 1713 08/13/18 0351  NA 136 136  K 3.6 3.6  CL 105 104  CO2 20* 25  GLUCOSE 132* 82  BUN 14 12  CREATININE 1.37* 1.37*  CALCIUM 9.6 9.3   GFR: Estimated Creatinine Clearance: 47.3 mL/min (A) (by C-G formula based on SCr of 1.37 mg/dL (H)). Liver Function Tests: Recent Labs  Lab 08/12/18 1713  AST 27  ALT 31  ALKPHOS 65  BILITOT 1.0  PROT 6.5  ALBUMIN 4.0   No results for input(s): LIPASE, AMYLASE in the last 168 hours. No results for input(s): AMMONIA in the last 168 hours. Coagulation Profile: No results for input(s): INR, PROTIME in the last 168 hours. Cardiac Enzymes: No results for input(s): CKTOTAL, CKMB, CKMBINDEX, TROPONINI in the last 168 hours. BNP (last 3 results) No results for input(s): PROBNP in the last 8760  hours. HbA1C: No results for input(s): HGBA1C in the last 72 hours. CBG: Recent Labs  Lab 08/12/18 2219 08/13/18 0658  GLUCAP 91 84   Lipid Profile: No results for input(s): CHOL, HDL, LDLCALC, TRIG, CHOLHDL, LDLDIRECT in the last 72 hours. Thyroid Function Tests: No results for input(s): TSH, T4TOTAL, FREET4, T3FREE, THYROIDAB in the last 72 hours. Anemia Panel: No results for input(s): VITAMINB12, FOLATE, FERRITIN, TIBC, IRON, RETICCTPCT in the last 72 hours. Sepsis Labs: No results for input(s): PROCALCITON, LATICACIDVEN in the last 168 hours.  Recent Results (from the past 240 hour(s))  SARS Coronavirus 2 (CEPHEID - Performed in [place] hospital lab), Hosp Order     Status: None   Collection Time: 08/04/18 11:18 AM   Specimen: Nasopharyngeal Swab  Result Value Ref Range Status   SARS Coronavirus 2 NEGATIVE NEGATIVE Final    Comment: (NOTE) If result is NEGATIVE SARS-CoV-2 target nucleic acids are NOT DETECTED. The SARS-CoV-2 RNA is generally detectable in upper and lower  respiratory specimens during the  acute phase of infection. The lowest  concentration of SARS-CoV-2 viral copies this assay can detect is 250  copies / mL. A negative result does not preclude SARS-CoV-2 infection  and should not be used as the sole basis for treatment or other  patient management decisions.  A negative result may occur with  improper specimen collection / handling, submission of specimen other  than nasopharyngeal swab, presence of viral mutation(s) within the  areas targeted by this assay, and inadequate number of viral copies  (<250 copies / mL). A negative result must be combined with clinical  observations, patient history, and epidemiological information. If result is POSITIVE SARS-CoV-2 target nucleic acids are DETECTED. The SARS-CoV-2 RNA is generally detectable in upper and lower  respiratory specimens dur ing the acute phase of infection.  Positive  results are indicative  of active infection with SARS-CoV-2.  Clinical  correlation with patient history and other diagnostic information is  necessary to determine patient infection status.  Positive results do  not rule out bacterial infection or co-infection with other viruses. If result is PRESUMPTIVE POSTIVE SARS-CoV-2 nucleic acids MAY BE PRESENT.   A presumptive positive result was obtained on the submitted specimen  and confirmed on repeat testing.  While 2019 novel coronavirus  (SARS-CoV-2) nucleic acids may be present in the submitted sample  additional confirmatory testing may be necessary for epidemiological  and / or clinical management purposes  to differentiate between  SARS-CoV-2 and other Sarbecovirus currently known to infect humans.  If clinically indicated additional testing with an alternate test  methodology [id number]) is advised. The SARS-CoV-2 RNA is generally  detectable in upper and lower respiratory sp ecimens during the acute  phase of infection. The expected result is Negative. Fact Sheet for Patients:  StrictlyIdeas.no Fact Sheet for Healthcare Providers: BankingDealers.co.za This test is not yet approved or cleared by the [place] FDA and has been authorized for detection and/or diagnosis of SARS-CoV-2 by FDA under an Emergency Use Authorization (EUA).  This EUA will remain in effect (meaning this test can be used) for the duration of the COVID-19 declaration under Section 564(b)(1) of the Act, 21 U.S.C. section 360bbb-3(b)(1), unless the authorization is terminated or revoked sooner. Performed at [facility], [place] [address], [place], [place] [address]   SARS Coronavirus 2 (CEPHEID - Performed in [place] hospital lab), Hosp Order     Status: None   Collection Time: 08/12/18  7:21 PM   Specimen: Nasopharyngeal Swab  Result Value Ref Range Status   SARS Coronavirus 2 NEGATIVE NEGATIVE Final    Comment: (NOTE) If  result is NEGATIVE SARS-CoV-2 target nucleic acids are NOT DETECTED. The SARS-CoV-2 RNA is generally detectable in upper and lower  respiratory specimens during the acute phase of infection. The lowest  concentration of SARS-CoV-2 viral copies this assay can detect is 250  copies / mL. A negative result does not preclude SARS-CoV-2 infection  and should not be used as the sole basis for treatment or other  patient management decisions.  A negative result may occur with  improper specimen collection / handling, submission of specimen other  than nasopharyngeal swab, presence of viral mutation(s) within the  areas targeted by this assay, and inadequate number of viral copies  (<250 copies / mL). A negative result must be combined with clinical  observations, patient history, and epidemiological information. If result is POSITIVE SARS-CoV-2 target nucleic acids are DETECTED. The SARS-CoV-2 RNA is generally detectable in upper and lower  respiratory specimens dur ing the acute phase of infection.  Positive  results are indicative of active infection with SARS-CoV-2.  Clinical  correlation with patient history and other diagnostic information is  necessary to determine patient infection status.  Positive results do  not rule out bacterial infection or co-infection with other viruses. If result is PRESUMPTIVE POSTIVE SARS-CoV-2 nucleic acids MAY BE PRESENT.   A presumptive positive result was obtained on the submitted specimen  and confirmed on repeat testing.  While 2019 novel coronavirus  (SARS-CoV-2) nucleic acids may be present in the submitted sample  additional confirmatory testing may be necessary for epidemiological  and / or clinical management purposes  to differentiate between  SARS-CoV-2 and other Sarbecovirus currently known to infect humans.  If clinically indicated additional testing with an alternate test  methodology [phone]) is advised. The SARS-CoV-2 RNA is generally   detectable in upper and lower respiratory sp ecimens during the acute  phase of infection. The expected result is Negative. Fact Sheet for Patients:  StrictlyIdeas.no Fact Sheet for Healthcare Providers: BankingDealers.co.za This test is not yet approved or cleared by the [place] FDA and has been authorized for detection and/or diagnosis of SARS-CoV-2 by FDA under an Emergency Use Authorization (EUA).  This EUA will remain in effect (meaning this test can be used) for the duration of the COVID-19 declaration under Section 564(b)(1) of the Act, 21 U.S.C. section 360bbb-3(b)(1), unless the authorization is terminated or revoked sooner. Performed at [facility], [place] [address], [place], [place] [address]          Radiology Studies: Ct Head Wo Contrast  Result Date: 08/12/2018 CLINICAL DATA:  Head trauma, headache. Fall, question syncope. EXAM: CT HEAD WITHOUT CONTRAST TECHNIQUE: Contiguous axial images were obtained from the base of the skull through the vertex without intravenous contrast. COMPARISON:  Right MRI 01/19/2018 FINDINGS: Brain: No intracranial hemorrhage, mass effect, or midline shift. No hydrocephalus. Brain volume is normal for age. Minor chronic small vessel ischemia. Remote lacunar infarct versus prominent perivascular space in the right insula. The basilar cisterns are patent. No evidence of territorial infarct or acute ischemia. No extra-axial or intracranial fluid collection. Vascular: No hyperdense vessel. Skull base atherosclerosis. Skull: No fracture or focal lesion. Sinuses/Orbits: Paranasal sinuses and mastoid air cells are clear. The visualized orbits are unremarkable. Other: None. IMPRESSION: No acute intracranial abnormality. Electronically Signed   By: Keith Rake M.D.   On: 08/12/2018 19:24        Scheduled Meds: . ARIPiprazole  5 mg Oral Daily  . aspirin  81 mg Oral Daily  . carvedilol   3.125 mg Oral BID WC  . enoxaparin (LOVENOX) injection  40 mg Subcutaneous Q24H  . escitalopram  10 mg Oral Daily  . pantoprazole  40 mg Oral Daily  . rosuvastatin  40 mg Oral QPM  . ticagrelor  90 mg Oral BID   Continuous Infusions:   LOS: 0 days    Time spent:     Irine Seal, MD [facility]  If 7PM-7AM, please contact night-coverage [email or web address] 08/13/2018, 11:06 AM

## 2018-08-13 NOTE — Consult Note (Addendum)
Cardiology Consultation:   Patient ID: CARLYLE MCELRATH MRN: [id number]; DOB: 1945/02/25  Admit date: 08/12/2018 Date of Consult: 08/13/2018  Primary Care Provider: Leanna Battles, MD Primary Cardiologist: Shelva Majestic, MD  Primary Electrophysiologist:  None    Patient Profile:   MARQUS MACPHEE is a 73 y.o. male with a history of CAD with recent STEMI on 07/28/2018 s/p DES to LAD, ischemic cardiomyopathy with EF of 30-35%, hypertension, hyperlipidemia, type 2 diabetes mellitus, GERD, and memory disorder, who is being seen today for the evaluation of syncope at the request of Dr. Hal Hope.  History of Present Illness:   Mr. Frampton is a 73 year old male with the above history. Patient was recently admitted from 07/28/2018 to 08/04/2018 for a anterior STEMI after presenting with centralized chest pain and shortness of breath. Patient underwent emergent cardiac catheterization and had successful PCI with DES to the proximal LAD. Echocardiogram showed LVEF of 30-35% with akinesis of the mid apical anteroseptal and inferoseptal walls and the true apex as well as severe hypokinesis of the mid to apical anterior wall, apical lateral wall, and apical inferior wall. Patient wad discharge on Aspirin 81mg , Brilinta 90mg  twice daily, Crestor 40mg  daily, Coreg 3.125mg  twice daily, Losartan 12.5mg  daily, and Spironolactone 12.5mg  daily. She was not felt to be a good candidate for a LifeVest given frail state and memory issues. He was discharged to a SNF.  Patient returned to the [facility] ED on 08/12/2018 via EMS following a fall. Patient returned home from SNF yesterday.  Very difficult to gather detailed history and ROS from patient given memory issues. He frequently pauses to try to figure out how to word something and has tangential thought process at times requiring redirection.  He reports he was standing up smoking outside and when he finished smoking he turned quickly and fell hitting his forearm on the  ground during the fall.  He cannot remember exactly what caused him to fall.  He does not remember tripping on anything.  He denies any palpitations, lightheadedness, dizziness, chest pain, shortness of breath prior to the fall.  In the ED, patient reported "he just returned home from rehab and was in an argument with his daughter and wife about getting cigarettes. Reports they wanted him to stop cold [place] and he disagreed and wanted cigarettes today.  He left the home after this argument and went outside to get cigarettes and he reports that he tripped and fell down." His daughter told the ED provider, that he has memory issues and is not a reliable historian. EMS was called and systolic BP was reportedly in the 80's upon their arrival but improved by the time they got to the ED.  Upon arrival to the ED, BP 105/64 and vitals stable. EKG showed normal sinus rhythm, rate 66 bpm, with PAC, LAFB, deep T wave inversions in anterolateral leads, biphasic T wave in lead II, and prolonged QTc of 572 ms. Head CT showed no acute intracranial abnormality. WBC 7.7, Hgb 16.2, Plts 156. Na 136, K 3.6, Glucose 132, SCr 1.37 (baseline 0.8 to 1.0). COVID-19 testing negative. Patient was admitted for further evaluation.  Orthostatic vital signs today showed: Laying-94/60 HR-59 Sitting-83/63 HR-58 Standing-66/51 HR-66 Standing 3 min-85/57 HR-70  At the time of this evaluation, patient reports that he is feeling fine. He says he did not want to come to the hospital in the first place. He states he has been doing well since being discharged from the hospital. He denies any  orthopnea, PND, or edema. He denies any abnormal bruising compared to baseline. He denied any hemoptysis or hematuria but notes possible blood on toilet paper when wiping after a bowel movement. When I tried to clarifying, patient said he did not know if he could answer all my questions appropriately.  Heart Pathway Score:     Past Medical History:   Diagnosis Date  . Depression   . Diabetes mellitus    type 2  . GERD (gastroesophageal reflux disease)   . Hyperlipidemia   . Hypertension   . Memory disorder 07/01/2018  . STEMI (ST elevation myocardial infarction) ([place])    07/31/18 PCI/DES x1 to LAD, EF 30-35%    Past Surgical History:  Procedure Laterality Date  . APPENDECTOMY  1960  . CHOLECYSTECTOMY  1995  . CORONARY STENT INTERVENTION N/A 07/28/2018   Procedure: CORONARY STENT INTERVENTION;  Surgeon: Troy Sine, MD;  Location: [place] [facility];  Service: Cardiovascular;  Laterality: N/A;  . CORONARY/GRAFT ACUTE MI REVASCULARIZATION N/A 07/28/2018   Procedure: Coronary/Graft Acute MI Revascularization;  Surgeon: Troy Sine, MD;  Location: [place] [facility];  Service: Cardiovascular;  Laterality: N/A;  . HEMORROIDECTOMY  1998  . LEFT HEART CATH AND CORONARY ANGIOGRAPHY N/A 07/28/2018   Procedure: LEFT HEART CATH AND CORONARY ANGIOGRAPHY;  Surgeon: Troy Sine, MD;  Location: [place] [facility];  Service: Cardiovascular;  Laterality: N/A;  . SHOULDER ARTHROSCOPY W/ ROTATOR CUFF REPAIR  2010   left    Inpatient Medications: Scheduled Meds: . ARIPiprazole  5 mg Oral Daily  . aspirin  81 mg Oral Daily  . carvedilol  3.125 mg Oral BID WC  . enoxaparin (LOVENOX) injection  40 mg Subcutaneous Q24H  . escitalopram  10 mg Oral Daily  . pantoprazole  40 mg Oral Daily  . rosuvastatin  40 mg Oral QPM  . ticagrelor  90 mg Oral BID   Continuous Infusions:  PRN Meds: acetaminophen **OR** acetaminophen, ALPRAZolam, nitroGLYCERIN, ondansetron **OR** ondansetron (ZOFRAN) IV  Allergies:    Allergies  Allergen Reactions  . Aricept [Donepezil Hcl]     Vivid dreams  . Sulfa Antibiotics Hives    Social History:   Social History   Socioeconomic History  . Marital status: Single    Spouse name: Not on file  . Number of children: Not on file  . Years of education: 38  . Highest education level: Not on file   Occupational History  . Occupation: Part time - [employer]  Social Needs  . Financial resource strain: Not on file  . Food insecurity    Worry: Not on file    Inability: Not on file  . Transportation needs    Medical: Not on file    Non-medical: Not on file  Tobacco Use  . Smoking status: Current Every Day Smoker    Packs/day: 0.50  . Smokeless tobacco: Never Used  Substance and Sexual Activity  . Alcohol use: No  . Drug use: No  . Sexual activity: Not on file  Lifestyle  . Physical activity    Days per week: Not on file    Minutes per session: Not on file  . Stress: Not on file  Relationships  . Social Herbalist on phone: Not on file    Gets together: Not on file    Attends religious service: Not on file    Active member of club or organization: Not on file    Attends meetings  of clubs or organizations: Not on file    Relationship status: Not on file  . Intimate partner violence    Fear of current or ex partner: Not on file    Emotionally abused: Not on file    Physically abused: Not on file    Forced sexual activity: Not on file  Other Topics Concern  . Not on file  Social History Narrative   Lives with wife   Caffeine use: 2 cups per day (coffee)   Left handed     Family History:    Family History  Problem Relation Age of Onset  . Heart attack Father   . Heart attack Brother      ROS:  Please see the history of present illness.  Review of Systems  Constitutional: Negative for fever.  HENT: Negative for congestion.   Respiratory: Negative for cough, hemoptysis and shortness of breath.   Cardiovascular: Negative for chest pain, orthopnea, leg swelling and PND.  Gastrointestinal: Blood in stool: possible blood on toilet paper with BMs.  Genitourinary: Negative for hematuria.  Musculoskeletal: Positive for falls. Negative for myalgias.  Neurological: Negative for dizziness and loss of consciousness.  Endo/Heme/Allergies: Does not  bruise/bleed easily.  Psychiatric/Behavioral: Positive for substance abuse.  All other systems reviewed and are negative.  Physical Exam/Data:   Vitals:   08/13/18 0037 08/13/18 0317 08/13/18 0634 08/13/18 0946  BP: (!) 92/51 (!) 100/58 102/62 109/62  Pulse: 60 (!) 56 (!) 55 (!) 56  Resp:   18 18  Temp:   98 F (36.7 C) (!) 97.4 F (36.3 C)  TempSrc:    Oral  SpO2:   98% 99%  Weight:      Height:       No intake or output data in the 24 hours ending 08/13/18 1112 Last 3 Weights 08/12/2018 08/05/2018 08/04/2018  Weight (lbs) 160 lb 4.4 oz 161 lb 160 lb  Weight (kg) 72.7 kg 73.029 kg 72.576 kg     Body mass index is 23.67 kg/m.  General:  Elderly Caucasian male resting comfortably in no acute distress. HEENT: Normal. Neck: Supple. No JVD. Vascular: No carotid bruits. Radial and distal pedal pulses 2+ and equal bilaterally. Cardiac: RRR with premature beats. Distinct S1 and S2. No murmurs, gallops, or rubs. Lungs: No increased work of breathing. Lung clear to ausculation bilaterally. No wheezes, rhonchi, or rales. Abd: Soft, non-distended, and non-tender. Bowel sounds present. Ext: No lower extremity edema. Musculoskeletal:  No deformities. Skin: Warm and dry. Neuro:  Alert and oriented x3. No focal deficits.  Psych: Come confusion. Slow speech with frequent pauses and tangential thought process.  EKG:  The EKG was personally reviewed: EKG in the ED showed:  EKG showed normal sinus rhythm, rate 66 bpm, with PAC, LAFB, left axis deviation, deep T wave inversions in anterolateral leads, biphasic T wave in lead II, and prolonged QTc of 572 ms. Repeat EKG this morning showed positive T wave in lead II but no other significant changes compared to yesterday. QTc remains prolonged at 591 ms.  Telemetry:  Telemetry was personally reviewed and demonstrates:  Sinus rhythm with rates in the high 50's to 70's and frequent PACs.  Relevant CV Studies: Left Heart Catheterization 07/28/2018:   Ost LAD lesion is 20% stenosed.  2nd LPL lesion is 80% stenosed.  LPDA lesion is 80% stenosed.  Dist LAD-1 lesion is 30% stenosed.  Dist LAD-2 lesion is 50% stenosed.  Prox LAD to Mid LAD lesion is 90% stenosed.  Post intervention, there is a 0% residual stenosis.  Prox RCA lesion is 30% stenosed.  A stent was successfully placed.   Acute coronary syndrome with initial ST elevation anteriorly followed by diffuse T wave inversion across the entire precordium and lateral leads secondary to long subtotal stenosis of the proximal to mid LAD in a large wrap around LAD.  There was initial TIMI II flow which deteriorated to TIMI I flow prior to the intervention.   Dominant left circumflex vessel with diffuse mid PDA stenosis just proximal to a bifurcation.  Mild 30% stenosis in the proximal RCA.  Successful PCI to the LAD with ultimate insertion of a 3.0 x 38 mm Resolute Onyx DES stent postdilated to 3.3 mm with a diffuse 90% long stenosis being reduced to 0% and improving to brisk TIMI-3 flow.  There is no change in his 20% ostial LAD stenosis 30% and 50% distal LAD stenoses following intervention.  Recommendation: DAPT for minimum of 12 months.  We will continue bivalirudin 4 hours post procedure.  Smoking cessation is essential.  Will titrate rosuvastatin to 40 mg.   Echo Doppler study to assess LV systolic and diastolic function. _______________  Echocardiogram 07/29/2018: 1. The left ventricle has moderate-severely reduced systolic function, with an ejection fraction of 30-35%. The cavity size was normal. Left ventricular diastolic Doppler parameters are consistent with impaired relaxation. There is akinesis of the mid  to apical anteroseptal and inferoseptal walls and the true apex. Severe hypokinesis of the mid to apical anterior wall, the apical lateral wall, and the apical inferior wall.  2. The right ventricle has normal systolic function. The cavity was normal. There is no  increase in right ventricular wall thickness.  3. No evidence of mitral valve stenosis. No significant mitral regurgitation.  4. The aortic valve is tricuspid. Mild calcification of the aortic valve. No stenosis of the aortic valve.  5. The aortic root is normal in size and structure.  6. Normal IVC size. No complete TR doppler jet so unable to estimate PA systolic pressure.  Laboratory Data:  High Sensitivity Troponin:   Recent Labs  Lab 07/28/18 1546 07/28/18 1815 07/28/18 2046  TROPONINIHS 513* 860* 1,608*     Cardiac EnzymesNo results for input(s): TROPONINI in the last 168 hours. No results for input(s): TROPIPOC in the last 168 hours.  Chemistry Recent Labs  Lab 08/12/18 1713 08/13/18 0351  NA 136 136  K 3.6 3.6  CL 105 104  CO2 20* 25  GLUCOSE 132* 82  BUN 14 12  CREATININE 1.37* 1.37*  CALCIUM 9.6 9.3  GFRNONAA 50* 50*  GFRAA 58* 58*  ANIONGAP 11 7    Recent Labs  Lab 08/12/18 1713  PROT 6.5  ALBUMIN 4.0  AST 27  ALT 31  ALKPHOS 65  BILITOT 1.0   Hematology Recent Labs  Lab 08/12/18 1713 08/13/18 0351  WBC 7.7 8.2  RBC 5.46 5.24  HGB 16.2 15.4  HCT 46.9 45.7  MCV 85.9 87.2  MCH 29.7 29.4  MCHC 34.5 33.7  RDW 12.5 12.6  PLT 156 133*   BNPNo results for input(s): BNP, PROBNP in the last 168 hours.  DDimer No results for input(s): DDIMER in the last 168 hours.   Radiology/Studies:  Ct Head Wo Contrast  Result Date: 08/12/2018 CLINICAL DATA:  Head trauma, headache. Fall, question syncope. EXAM: CT HEAD WITHOUT CONTRAST TECHNIQUE: Contiguous axial images were obtained from the base of the skull through the vertex without intravenous contrast. COMPARISON:  Right MRI 01/19/2018 FINDINGS: Brain: No intracranial hemorrhage, mass effect, or midline shift. No hydrocephalus. Brain volume is normal for age. Minor chronic small vessel ischemia. Remote lacunar infarct versus prominent perivascular space in the right insula. The basilar cisterns are patent. No  evidence of territorial infarct or acute ischemia. No extra-axial or intracranial fluid collection. Vascular: No hyperdense vessel. Skull base atherosclerosis. Skull: No fracture or focal lesion. Sinuses/Orbits: Paranasal sinuses and mastoid air cells are clear. The visualized orbits are unremarkable. Other: None. IMPRESSION: No acute intracranial abnormality. Electronically Signed   By: Keith Rake M.D.   On: 08/12/2018 19:24    Assessment and Plan:   Orthostatic Hypotension/Questionable Syncope - Patient admitted after fall. Patient denies any LOC but he has memory issues and daughter reports he is an unreliable historian. Patient denies any prodromal symptoms and states he turned around after smoking a cigarette and just fell. EMS was called and systolic BP was in the 07'P upon their arrival. Patient 572mL bolus of normal saline last night. Most recent BP 109/62. - Orthostatic positive this morning. See above. - Cortisol normal. - Telemetry shows normal sinus rhythm with PACs but no significant arrhythmias.  - Sounds like this was all orthostatic hypotension. Will need to back off on his home medications. Given frequent PACs and prolonged QTc, will consider low dose Coreg 3.125mg  twice daily. Recommend rechecking orthostatics in the morning. If still positive, may need to discontinue Coreg.  CAD with Recent STEMI - Patient had anterior STEMI on 07/28/2018 s/p DES to proximal LAD. - EKG shows continued deep T wave inversion in anterolateral leads. - Stable. Patient denies any angina. - Continue dual antiplatelet therapy with Aspirin and Brilinta. - Continue low dose beta-blocker and high-intensity statin.  Ischemic Cardiomyopathy - Echo following STEMI showed LVEF of 30-35% with multiple wall motion abnormalities. See full report above. - Patient appears euvolemic on exam. Not on any diuretics at home. - Continue home Coreg as above. - Will discontinue home Losartan and Spirolactone  given hypotension. Could consider trying to restart low dose Losartan as outpatient if he is able to tolerate Coreg.  QTc Prolongation - QT/QTc 545/572 >> 560/591. - Potassium 3.6.  - Continue Coreg as above. - Avoid QT prolonging medications.   AKI - Serum creatinine 1.37 on admission. Baseline 0.8 to 1.0. Repeat remains at 1.37 this morning. - Likely from hypotension.  - Will hold home Losartan and Spironolactone at this time.     For questions or updates, please contact [place] Please consult [email or web address] for contact info under     Signed, Darreld Mclean, PA-C  08/13/2018 11:12 AM  Personally seen and examined. Agree with above.   73 year old with recent STEMI anterior here with fall/possible syncope loss of consciousness with significant orthostatic hypotension. Currently laying in bed.  Difficult for him to complete a thought.  GEN: Well nourished, well developed, in no acute distress  HEENT: normal  Neck: no JVD, carotid bruits, or masses Cardiac: RRR; no murmurs, rubs, or gallops,no edema  Respiratory:  clear to auscultation bilaterally, normal work of breathing GI: soft, nontender, nondistended, + BS MS: no deformity or atrophy  Skin: warm and dry, no rash Neuro:  Alert and Oriented x 3, Strength and sensation are intact Psych: euthymic mood, challenging to get a complete thought out.  Lab work, EKG, telemetry reviewed.  Occasional PACs.  Personally reviewed.  Assessment and plan:  Significant orthostatic hypotension - Agree with stopping losartan, spironolactone.  Currently on low-dose  carvedilol 3.125 twice a day.  We may need to stop this as well if he continues to be orthostatic.  This does go hand-in-hand with his parkinsonian disease.  Also, he may have been dehydrated/prerenal azotemia noted on lab work with mildly elevated creatinine.  Seems to have improved with gentle fluids.  In addition, his ischemic cardiomyopathy prior EF 30% is also  playing a role.  - EKG is abnormal with marked T wave inversion and prolonged QT however I do not think that he likely had an arrhythmic event.  Orthostatic hypotension was clearly noted on admission.  We will see him tomorrow morning.  Candee Furbish, MD

## 2018-08-14 DIAGNOSIS — I951 Orthostatic hypotension: Principal | ICD-10-CM

## 2018-08-14 LAB — CBC
HCT: 43.4 % (ref 39.0–52.0)
Hemoglobin: 14.9 g/dL (ref 13.0–17.0)
MCH: 29.7 pg (ref 26.0–34.0)
MCHC: 34.3 g/dL (ref 30.0–36.0)
MCV: 86.5 fL (ref 80.0–100.0)
Platelets: 120 10*3/uL — ABNORMAL LOW (ref 150–400)
RBC: 5.02 MIL/uL (ref 4.22–5.81)
RDW: 12.5 % (ref 11.5–15.5)
WBC: 6.5 10*3/uL (ref 4.0–10.5)
nRBC: 0 % (ref 0.0–0.2)

## 2018-08-14 LAB — BASIC METABOLIC PANEL
Anion gap: 7 (ref 5–15)
BUN: 8 mg/dL (ref 8–23)
CO2: 22 mmol/L (ref 22–32)
Calcium: 8.9 mg/dL (ref 8.9–10.3)
Chloride: 109 mmol/L (ref 98–111)
Creatinine, Ser: 1.09 mg/dL (ref 0.61–1.24)
GFR calc Af Amer: >60 mL/min (ref >60)
GFR calc non Af Amer: >60 mL/min (ref >60)
Glucose, Bld: 88 mg/dL (ref 70–99)
Potassium: 3.5 mmol/L (ref 3.5–5.1)
Sodium: 138 mmol/L (ref 135–145)

## 2018-08-14 MED ORDER — POTASSIUM CHLORIDE CRYS ER 20 MEQ PO TBCR
40.0000 meq | EXTENDED_RELEASE_TABLET | Freq: Once | ORAL | Status: AC
  Administered 2018-08-14: 40 meq via ORAL
  Filled 2018-08-14: qty 2

## 2018-08-14 MED ORDER — MIDODRINE HCL 5 MG PO TABS
2.5000 mg | ORAL_TABLET | Freq: Three times a day (TID) | ORAL | Status: DC
  Administered 2018-08-14 – 2018-08-15 (×3): 2.5 mg via ORAL
  Filled 2018-08-14 (×4): qty 1

## 2018-08-14 NOTE — Progress Notes (Signed)
PROGRESS NOTE    BUBBER ROTHERT  BHA:[id number] DOB: Apr 12, 1945 DOA: 08/12/2018 PCP: Leanna Battles, MD    Brief Narrative: HPI per Dr. Donnella Bi is a 73 y.o. male with history of recent ST elevation MI status post stent placement, ischemic cardiomyopathy was brought to the ER after patient had a fall at the living facility.  Per report patient had some argument with family and was turning back and suddenly felt.  It is not clear if patient lost consciousness but per family patient has poor memory and history is not very reliable.  EMS was called and patient blood pressure initially was found to be 973 systolic.  Patient denies any dizziness prior to the fall any chest pain shortness of breath nausea vomiting or diarrhea.  ED Course: In the ER patient blood pressure was more than 532 systolic with the labs showing increased creatinine of 1.37 from baseline of 1.02 last week.  EKG was showing no new changes with old T wave inversion in anterolateral leads.  Patient did not have any chest pain CT it is unremarkable COVID-19 test was negative.  Later when checking orthostatic changes patient was clearly orthostatic blood pressure dropping to 60s on standing.  Patient admitted for possible syncope fall and orthostatic changes.  Assessment & Plan:   Principal Problem:   Syncope Active Problems:   Memory disorder   Parkinson's disease ([place])   Ischemic cardiomyopathy   ARF (acute renal failure) (HCC)   Hypotension   Orthostatic hypotension  1 orthostatic hypotension/fall/probable syncope Patient noted to be significantly orthostatic on admission.  Patient currently on telemetry.  Patient noted to be still orthostatic this morning with orthostasis despite fluid boluses.  Patient with recent non-ST elevated MI with a cardiomyopathy with a EF of 30 to 35% and as such with patient's possible syncope with orthostasis/hypotension concern for possible cardiac etiology.  Patient with no  signs or symptoms of infection.  Patient afebrile.  Patient with a normal white count.  Random cortisol of 15.9.  Patient with no overt bleeding hemoglobin currently stable at 14.9.  Cardiology has been consulted and are following.  Coreg discontinued per cardiology.  ARB and spironolactone on hold.  Place TED hose.  Continue hydration.  If patient still orthostatic will start on low-dose midodrine 2.5 to 5 mg 3 times daily.  Follow.   2.  Acute kidney injury Questionable etiology.  Likely secondary to a prerenal azotemia as patient noted to have orthostatic hypotension in the setting of ARB, spironolactone.  Patient's ARB was held on admission.  Patient still orthostatic and as such we spironolactone has been discontinued.  Renal function improving creatinine down to 1.09.  Decrease IV fluid rate.  Follow.   3.  Recent STEMI(07/31/2018)/ischemic cardiomyopathy EF 30 to 35% Patient with recent STEMI status post stent placement on aspirin and Brilinta and Coreg and Lipitor.  Patient also noted to be on spironolactone and ARB.  Spironolactone ARB discontinued secondary to acute kidney injury as well as orthostatic hypotension.  Continue aspirin and Brilinta and Lipitor.  Coreg discontinued per cardiology secondary to ongoing orthostasis.  Cardiology following and appreciate the input and recommendations.   4.  Memory issues Continue Abilify and Lexapro.  5.  Hyperlipidemia Continue statin.  6.  Hypertension ARB, spironolactone have been discontinued due to problem #1.  Patient still orthostatic and as such Coreg discontinued per cardiology.  Follow.  7.   DVT prophylaxis: Lovenox Code Status: Full Family Communication: Updated patient.  No family at bedside. Disposition Plan: Back to living facility when clinically improved and medically stable with no longer orthostasis..   Consultants:   Cardiology: Dr. Marlou Porch 08/13/2018    Procedures:   CT head without contrast  08/12/2018  Antimicrobials:  None   Subjective: Patient laying in bed.  Denies any chest pain or shortness of breath.  Denies any dizziness.  Wants to go home.   Objective: Vitals:   08/14/18 0658 08/14/18 0659 08/14/18 0701 08/14/18 0948  BP: (!) 76/48 (!) 87/53 (!) 114/58 112/62  Pulse: 65 65 64 (!) 57  Resp:    18  Temp:    97.8 F (36.6 C)  TempSrc:    Oral  SpO2: 100% 96% 99% 98%  Weight:      Height:        Intake/Output Summary (Last 24 hours) at 08/14/2018 1639 Last data filed at 08/14/2018 1200 Gross per 24 hour  Intake 1949.69 ml  Output --  Net 1949.69 ml   Filed Weights   08/12/18 2156 08/13/18 2018 08/14/18 0453  Weight: 72.7 kg 75.7 kg 75.7 kg    Examination:  General exam: NAD Respiratory system: CTAB.  No wheezes, no crackles, no rhonchi.  Normal respiratory effort.  Cardiovascular system: Regular rate and rhythm no murmurs rubs or gallops.  No JVD.  No lower extremity edema. Gastrointestinal system: Abdomen is soft, nontender, nondistended, positive bowel sounds.  No rebound.  No guarding.  Central nervous system: Alert and oriented. No focal neurological deficits. Extremities: Symmetric 5 x 5 power. Skin: No rashes, lesions or ulcers Psychiatry: Judgement and insight appear fair. Mood & affect appropriate.     Data Reviewed: I have personally reviewed following labs and imaging studies  CBC: Recent Labs  Lab 08/12/18 1713 08/13/18 0351 08/14/18 0430  WBC 7.7 8.2 6.5  NEUTROABS 5.5  --   --   HGB 16.2 15.4 14.9  HCT 46.9 45.7 43.4  MCV 85.9 87.2 86.5  PLT 156 133* 947*   Basic Metabolic Panel: Recent Labs  Lab 08/12/18 1713 08/13/18 0351 08/14/18 0430  NA 136 136 138  K 3.6 3.6 3.5  CL 105 104 109  CO2 20* 25 22  GLUCOSE 132* 82 88  BUN 14 12 8   CREATININE 1.37* 1.37* 1.09  CALCIUM 9.6 9.3 8.9   GFR: Estimated Creatinine Clearance: 59.5 mL/min (by C-G formula based on SCr of 1.09 mg/dL). Liver Function Tests: Recent Labs   Lab 08/12/18 1713  AST 27  ALT 31  ALKPHOS 65  BILITOT 1.0  PROT 6.5  ALBUMIN 4.0   No results for input(s): LIPASE, AMYLASE in the last 168 hours. No results for input(s): AMMONIA in the last 168 hours. Coagulation Profile: No results for input(s): INR, PROTIME in the last 168 hours. Cardiac Enzymes: No results for input(s): CKTOTAL, CKMB, CKMBINDEX, TROPONINI in the last 168 hours. BNP (last 3 results) No results for input(s): PROBNP in the last 8760 hours. HbA1C: No results for input(s): HGBA1C in the last 72 hours. CBG: Recent Labs  Lab 08/12/18 2219 08/13/18 0658  GLUCAP 91 84   Lipid Profile: No results for input(s): CHOL, HDL, LDLCALC, TRIG, CHOLHDL, LDLDIRECT in the last 72 hours. Thyroid Function Tests: No results for input(s): TSH, T4TOTAL, FREET4, T3FREE, THYROIDAB in the last 72 hours. Anemia Panel: No results for input(s): VITAMINB12, FOLATE, FERRITIN, TIBC, IRON, RETICCTPCT in the last 72 hours. Sepsis Labs: No results for input(s): PROCALCITON, LATICACIDVEN in the last 168 hours.  Recent  Results (from the past 240 hour(s))  SARS Coronavirus 2 (CEPHEID - Performed in [place] hospital lab), Hosp Order     Status: None   Collection Time: 08/12/18  7:21 PM   Specimen: Nasopharyngeal Swab  Result Value Ref Range Status   SARS Coronavirus 2 NEGATIVE NEGATIVE Final    Comment: (NOTE) If result is NEGATIVE SARS-CoV-2 target nucleic acids are NOT DETECTED. The SARS-CoV-2 RNA is generally detectable in upper and lower  respiratory specimens during the acute phase of infection. The lowest  concentration of SARS-CoV-2 viral copies this assay can detect is 250  copies / mL. A negative result does not preclude SARS-CoV-2 infection  and should not be used as the sole basis for treatment or other  patient management decisions.  A negative result may occur with  improper specimen collection / handling, submission of specimen other  than nasopharyngeal swab,  presence of viral mutation(s) within the  areas targeted by this assay, and inadequate number of viral copies  (<250 copies / mL). A negative result must be combined with clinical  observations, patient history, and epidemiological information. If result is POSITIVE SARS-CoV-2 target nucleic acids are DETECTED. The SARS-CoV-2 RNA is generally detectable in upper and lower  respiratory specimens dur ing the acute phase of infection.  Positive  results are indicative of active infection with SARS-CoV-2.  Clinical  correlation with patient history and other diagnostic information is  necessary to determine patient infection status.  Positive results do  not rule out bacterial infection or co-infection with other viruses. If result is PRESUMPTIVE POSTIVE SARS-CoV-2 nucleic acids MAY BE PRESENT.   A presumptive positive result was obtained on the submitted specimen  and confirmed on repeat testing.  While 2019 novel coronavirus  (SARS-CoV-2) nucleic acids may be present in the submitted sample  additional confirmatory testing may be necessary for epidemiological  and / or clinical management purposes  to differentiate between  SARS-CoV-2 and other Sarbecovirus currently known to infect humans.  If clinically indicated additional testing with an alternate test  methodology [phone]) is advised. The SARS-CoV-2 RNA is generally  detectable in upper and lower respiratory sp ecimens during the acute  phase of infection. The expected result is Negative. Fact Sheet for Patients:  StrictlyIdeas.no Fact Sheet for Healthcare Providers: BankingDealers.co.za This test is not yet approved or cleared by the [place] FDA and has been authorized for detection and/or diagnosis of SARS-CoV-2 by FDA under an Emergency Use Authorization (EUA).  This EUA will remain in effect (meaning this test can be used) for the duration of the COVID-19 declaration under  Section 564(b)(1) of the Act, 21 U.S.C. section 360bbb-3(b)(1), unless the authorization is terminated or revoked sooner. Performed at [facility], [place] [address] [place], [place] [address]          Radiology Studies: Ct Head Wo Contrast  Result Date: 08/12/2018 CLINICAL DATA:  Head trauma, headache. Fall, question syncope. EXAM: CT HEAD WITHOUT CONTRAST TECHNIQUE: Contiguous axial images were obtained from the base of the skull through the vertex without intravenous contrast. COMPARISON:  Right MRI 01/19/2018 FINDINGS: Brain: No intracranial hemorrhage, mass effect, or midline shift. No hydrocephalus. Brain volume is normal for age. Minor chronic small vessel ischemia. Remote lacunar infarct versus prominent perivascular space in the right insula. The basilar cisterns are patent. No evidence of territorial infarct or acute ischemia. No extra-axial or intracranial fluid collection. Vascular: No hyperdense vessel. Skull base atherosclerosis. Skull: No fracture or focal lesion.  Sinuses/Orbits: Paranasal sinuses and mastoid air cells are clear. The visualized orbits are unremarkable. Other: None. IMPRESSION: No acute intracranial abnormality. Electronically Signed   By: Keith Rake M.D.   On: 08/12/2018 19:24        Scheduled Meds:  ARIPiprazole  5 mg Oral Daily   aspirin  81 mg Oral Daily   enoxaparin (LOVENOX) injection  40 mg Subcutaneous Q24H   escitalopram  10 mg Oral Daily   pantoprazole  40 mg Oral Daily   rosuvastatin  40 mg Oral QPM   ticagrelor  90 mg Oral BID   Continuous Infusions:   LOS: 1 day    Time spent: 35 minutes    Irine Seal, MD [facility]  If 7PM-7AM, please contact night-coverage [email or web address] 08/14/2018, 4:39 PM

## 2018-08-14 NOTE — Progress Notes (Signed)
Progress Note  Patient Name: Austin Mora Date of Encounter: 08/14/2018  Primary Cardiologist: Shelva Majestic, MD   Subjective   No further near syncope or syncope.  Blood pressures mildly improved but still quite low early this AM.  No chest pain. Wants to go home  Inpatient Medications    Scheduled Meds:  ARIPiprazole  5 mg Oral Daily   aspirin  81 mg Oral Daily   carvedilol  3.125 mg Oral BID WC   enoxaparin (LOVENOX) injection  40 mg Subcutaneous Q24H   escitalopram  10 mg Oral Daily   pantoprazole  40 mg Oral Daily   potassium chloride  40 mEq Oral Once   rosuvastatin  40 mg Oral QPM   ticagrelor  90 mg Oral BID   Continuous Infusions:  sodium chloride 100 mL/hr at 08/14/18 0808   PRN Meds: acetaminophen **OR** acetaminophen, ALPRAZolam, nitroGLYCERIN, ondansetron **OR** ondansetron (ZOFRAN) IV   Vital Signs    Vitals:   08/14/18 0655 08/14/18 0658 08/14/18 0659 08/14/18 0701  BP: (!) 94/57 (!) 76/48 (!) 87/53 (!) 114/58  Pulse: (!) 59 65 65 64  Resp: 15     Temp: 97.6 F (36.4 C)     TempSrc: Oral     SpO2: 98% 100% 96% 99%  Weight:      Height:        Intake/Output Summary (Last 24 hours) at 08/14/2018 0913 Last data filed at 08/14/2018 0601 Gross per 24 hour  Intake 2089.69 ml  Output --  Net 2089.69 ml   Last 3 Weights 08/14/2018 08/13/2018 08/12/2018  Weight (lbs) 166 lb 14.2 oz 166 lb 14.2 oz 160 lb 4.4 oz  Weight (kg) 75.7 kg 75.7 kg 72.7 kg      Telemetry    No adverse arrhythmias- Personally Reviewed  ECG    Sinus rhythm, T wave inversions noted- Personally Reviewed  Physical Exam   GEN: No acute distress.   Neck: No JVD Cardiac: RRR, no murmurs, rubs, or gallops.  Respiratory: Clear to auscultation bilaterally. GI: Soft, nontender, non-distended  MS: No edema; No deformity. Neuro:  Nonfocal  Psych: Normal affect, parkinsonian noted  Labs    High Sensitivity Troponin:   Recent Labs  Lab 07/28/18 1546 07/28/18 1815  07/28/18 2046  TROPONINIHS 513* 860* 1,608*      Cardiac EnzymesNo results for input(s): TROPONINI in the last 168 hours. No results for input(s): TROPIPOC in the last 168 hours.   Chemistry Recent Labs  Lab 08/12/18 1713 08/13/18 0351 08/14/18 0430  NA 136 136 138  K 3.6 3.6 3.5  CL 105 104 109  CO2 20* 25 22  GLUCOSE 132* 82 88  BUN 14 12 8   CREATININE 1.37* 1.37* 1.09  CALCIUM 9.6 9.3 8.9  PROT 6.5  --   --   ALBUMIN 4.0  --   --   AST 27  --   --   ALT 31  --   --   ALKPHOS 65  --   --   BILITOT 1.0  --   --   GFRNONAA 50* 50* >60  GFRAA 58* 58* >60  ANIONGAP 11 7 7      Hematology Recent Labs  Lab 08/12/18 1713 08/13/18 0351 08/14/18 0430  WBC 7.7 8.2 6.5  RBC 5.46 5.24 5.02  HGB 16.2 15.4 14.9  HCT 46.9 45.7 43.4  MCV 85.9 87.2 86.5  MCH 29.7 29.4 29.7  MCHC 34.5 33.7 34.3  RDW 12.5 12.6 12.5  PLT 156 133* 120*    BNPNo results for input(s): BNP, PROBNP in the last 168 hours.   DDimer No results for input(s): DDIMER in the last 168 hours.   Radiology    Ct Head Wo Contrast  Result Date: 08/12/2018 CLINICAL DATA:  Head trauma, headache. Fall, question syncope. EXAM: CT HEAD WITHOUT CONTRAST TECHNIQUE: Contiguous axial images were obtained from the base of the skull through the vertex without intravenous contrast. COMPARISON:  Right MRI 01/19/2018 FINDINGS: Brain: No intracranial hemorrhage, mass effect, or midline shift. No hydrocephalus. Brain volume is normal for age. Minor chronic small vessel ischemia. Remote lacunar infarct versus prominent perivascular space in the right insula. The basilar cisterns are patent. No evidence of territorial infarct or acute ischemia. No extra-axial or intracranial fluid collection. Vascular: No hyperdense vessel. Skull base atherosclerosis. Skull: No fracture or focal lesion. Sinuses/Orbits: Paranasal sinuses and mastoid air cells are clear. The visualized orbits are unremarkable. Other: None. IMPRESSION: No acute  intracranial abnormality. Electronically Signed   By: Keith Rake M.D.   On: 08/12/2018 19:24    Cardiac Studies   Ejection fraction 30 to 35% June 2020 in the setting of STEMI  Patient Profile     73 y.o. male with ischemic cardiomyopathy recent anterior STEMI in June 2020 with syncope/near syncope/fall with documented hypotension/abnormal orthostatics  Assessment & Plan    Significant hypotension/severe orthostatics - Exacerbated by parkinsonian and iatrogenic response to medications. - We will stop coreg 3.125mg  PO BID as well.   - If still dizzy when standing, consider midodrine 5mg  PO BID or TID with meals.   - Hydrating.   - Cortisol OK 16  Ischemic cardiomyopathy - Unfortunately unable to utilize traditional medications, they had to be stopped because of marked hypotension. No signs of hypoxia with IV hydration.   Acute kidney injury -Prerenal azotemia.  Stopped diuretic. Improved creat from 1.37 to 1.09   [facility] will sign off.   Medication Recommendations:  As above Other recommendations (labs, testing, etc):  As outpatient Follow up as an outpatient:  As scheduled. Dr. Harl Bowie. PCP as well soon.   For questions or updates, please contact [place] Please consult [email or web address] for contact info under        Signed, Candee Furbish, MD  08/14/2018, 9:13 AM

## 2018-08-14 NOTE — Progress Notes (Signed)
Occupational Therapy Evaluation Patient Details Name: Austin Mora MRN: [id number] DOB: 28-Aug-1945 Today's Date: 08/14/2018    History of Present Illness Pt admitted s/p sycnopal event with fall at home on 08/12/18.  Pt has just been discharged home from SNF [facility]) and had been home 2 1/2 hours per daughter report. Pt with history of CAD with recent STEMI on 07/28/2018 s/p DES to LAD, ischemic cardiomyopathy with EF of 30-35%, hypertension, hyperlipidemia, type 2 diabetes mellitus, GERD, and memory disorder.   Clinical Impression   Pt admitted s/p syncopal event.  Pt was independent prior to STEMI on 07/28/18. Pt had been discharged to [facility] for rehab following previous admission and has been discharged home. Pt was home for 2 1/2 hours per daughter report when he had syncopal event with fall.  Pt lives with wife but she is unable to provide physical assist due to broken wrist. He lives in a second floor apt.  During session today, he requires min assist with LB ADLs.  Min assist for sit<>stand and min guard to take steps with 1 person hand held assist.  (pt requesting to return to bed at end of session and wanted to talk to daughter who had just called on his room phone).  Recommending SNF vs HH pending progress with therapy. Pt will need to be able to manage a full flight of steps and perform functional mobility tasks and ADLs at supervision level since wife cannot physically assist. Will continue to follow acutely in order to maximize safety and independence with ADLs.    Follow Up Recommendations  Home health OT;Supervision/Assistance - 24 hour;SNF    Equipment Recommendations  3 in 1 bedside commode    Recommendations for Other Services       Precautions / Restrictions Precautions Precautions: Fall Restrictions Weight Bearing Restrictions: No      Mobility Bed Mobility Overal bed mobility: Needs Assistance Bed Mobility: Sit to Supine       Sit to supine: Supervision    General bed mobility comments: increased time, supervision for safety  Transfers Overall transfer level: Needs assistance Equipment used: 1 person hand held assist Transfers: Sit to/from Stand Sit to Stand: Min assist         General transfer comment: Assist for power up. 1 person hand held assist for steadying.    Balance                                           ADL either performed or assessed with clinical judgement   ADL Overall ADL's : Needs assistance/impaired Eating/Feeding: Independent;Bed level   Grooming: Supervision/safety;Cueing for sequencing;Sitting;Wash/dry face;Brushing hair   Upper Body Bathing: Supervision/ safety;Sitting   Lower Body Bathing: Minimal assistance;Sit to/from stand   Upper Body Dressing : Supervision/safety;Sitting   Lower Body Dressing: Minimal assistance;Sit to/from stand   Toilet Transfer: Minimal assistance;Ambulation Toilet Transfer Details (indicate cue type and reason): simulated taking steps to [facility], declining sitting up in chair at end of session Toileting- Clothing Manipulation and Hygiene: Minimal assistance;Sit to/from stand;Cueing for sequencing Toileting - Clothing Manipulation Details (indicate cue type and reason): Pt stood to use urinal at bedside.     Functional mobility during ADLs: Min guard(taking steps toward [facility]) General ADL Comments: Pt sitting EOB on therapist arrival preparing to stand with RN. Therapist assisted pt with sit<>stand so he could use urinal.  Therapist assisted pt  with donning TED hose. Pt able to don socks over TED hose with min assist.      Vision         Perception     Praxis      Pertinent Vitals/Pain Pain Assessment: No/denies pain     Hand Dominance     Extremity/Trunk Assessment Upper Extremity Assessment Upper Extremity Assessment: Generalized weakness   Lower Extremity Assessment Lower Extremity Assessment: Defer to PT evaluation       Communication  Communication Communication: HOH   Cognition Arousal/Alertness: Awake/alert Behavior During Therapy: WFL for tasks assessed/performed Overall Cognitive Status: Impaired/Different from baseline Area of Impairment: Memory;Following commands;Problem solving                     Memory: Decreased short-term memory Following Commands: Follows one step commands with increased time;Follows multi-step commands inconsistently;Follows one step commands consistently     Problem Solving: Slow processing;Decreased initiation;Difficulty sequencing;Requires verbal cues;Requires tactile cues General Comments: Pt tangential in conversation.  After multiple prompts, pt was able to recall events of past few weeks and able to recall reason for current admission.    General Comments  HR in 60s during session.  Pt already sitting EOB preparing to stand on OT arrival, so unable to take orthostatic BPs.  Pt denies dizziness during session.    Exercises     Shoulder Instructions      Home Living Family/patient expects to be discharged to:: Private residence Living Arrangements: Spouse/significant other Available Help at Discharge: Family;Available PRN/intermittently Type of Home: Apartment Home Access: Stairs to enter Entrance Stairs-Number of Steps: flight Entrance Stairs-Rails: Right;Left Home Layout: One level     Bathroom Shower/Tub: Teacher, early years/pre: Standard     Home Equipment: None   Additional Comments: Per daughter report, pt's wife broke wrist recently and is unable to provide physical assist.  Pt had been home for 2 1/2 hours after discharge from [facility].  Daughter reports HH was supposed to come out to apt next day but she had to cancel since he was re-admitted.       Prior Functioning/Environment Level of Independence: Needs assistance  Gait / Transfers Assistance Needed: Daughter reports pt has been ambulating without device at [facility].  ADL's /  Homemaking Assistance Needed: Had been discharged from [facility] rehab. Daughter is unsure how well he has been doing with ADLs over past 1-2 weeks.   Comments: Independent prior to STEMI in June 2020.  Pt has been at [facility] for rehab and had been discharge home to his apt to live with wife.         OT Problem List: Impaired balance (sitting and/or standing);Decreased activity tolerance;Decreased knowledge of use of DME or AE;Decreased cognition      OT Treatment/Interventions: Self-care/ADL training;DME and/or AE instruction;Therapeutic activities;Patient/family education;Cognitive remediation/compensation;Balance training    OT Goals(Current goals can be found in the care plan section) Acute Rehab OT Goals Patient Stated Goal: to go home OT Goal Formulation: With patient Time For Goal Achievement: 08/28/18 Potential to Achieve Goals: Good  OT Frequency: Min 2X/week   Barriers to D/C: Decreased caregiver support  wife unable to provide heavy physical assist due to her broken wrist       Co-evaluation              AM-PAC OT "6 Clicks" Daily Activity     Outcome Measure Help from another person eating meals?: None Help from another person taking care of  personal grooming?: A Little(sequencing cues) Help from another person toileting, which includes using toliet, bedpan, or urinal?: A Little Help from another person bathing (including washing, rinsing, drying)?: A Little Help from another person to put on and taking off regular upper body clothing?: None Help from another person to put on and taking off regular lower body clothing?: A Little 6 Click Score: 20   End of Session Nurse Communication: Mobility status  Activity Tolerance: Patient tolerated treatment well Patient left: in bed;with call bell/phone within reach;with bed alarm set  OT Visit Diagnosis: Unsteadiness on feet (R26.81);Other symptoms and signs involving cognitive function;Muscle weakness (generalized)  (M62.81)                Time: 1110-1150 OT Time Calculation (min): 40 min Charges:  OT General Charges $OT Visit: 1 Visit OT Evaluation $OT Eval Moderate Complexity: 1 Mod OT Treatments $Self Care/Home Management : 8-22 mins $Therapeutic Activity: 8-22 mins    Darrol Jump OTR/L [place] [phone] 08/14/2018, 1:04 PM

## 2018-08-14 NOTE — Progress Notes (Signed)
Spoke with daughter Amy and updated her on patient condition.  Questions answered.

## 2018-08-14 NOTE — Evaluation (Signed)
Physical Therapy Evaluation Patient Details Name: Austin Mora MRN: [id number] DOB: 1945-12-24 Today's Date: 08/14/2018   History of Present Illness  Pt admitted s/p sycnopal event with fall at home on 08/12/18.  Pt has just been discharged home from SNF [facility]) and had been home 2 1/2 hours per daughter report. Pt with history of CAD with recent STEMI on 07/28/2018 s/p DES to LAD, ischemic cardiomyopathy with EF of 30-35%, hypertension, hyperlipidemia, type 2 diabetes mellitus, GERD, and memory disorder.    Clinical Impression  Pt admitted with above diagnosis. Pt currently with functional limitations due to the deficits listed below (see PT Problem List). Pt with hx of falls at home, toady ambulating short hallway distances with unsteadiness, use of RW and hands on guarding. possible to progress to HHPT, will update recs if appropriate.  Pt will benefit from skilled PT to increase their independence and safety with mobility to allow discharge to the venue listed below.         Follow Up Recommendations SNF;Supervision/Assistance - 24 hour    Equipment Recommendations  None recommended by PT    Recommendations for Other Services       Precautions / Restrictions Precautions Precautions: Fall Restrictions Weight Bearing Restrictions: No      Mobility  Bed Mobility Overal bed mobility: Needs Assistance Bed Mobility: Sit to Supine       Sit to supine: Supervision   General bed mobility comments: increased time, supervision for safety  Transfers Overall transfer level: Needs assistance Equipment used: 1 person hand held assist Transfers: Sit to/from Stand Sit to Stand: Min assist         General transfer comment: Assist for power up. 1 person hand held assist for steadying.  Ambulation/Gait Ambulation/Gait assistance: Min guard Gait Distance (Feet): 60 Feet Assistive device: Rolling walker (2 wheeled) Gait Pattern/deviations: Shuffle;Decreased stride  length;Trunk flexed Gait velocity: decreased   General Gait Details: pt with short step length, slow gait. use of RW, slow and mild unsteady  Stairs            Wheelchair Mobility    Modified Rankin (Stroke Patients Only)       Balance Overall balance assessment: Needs assistance Sitting-balance support: No upper extremity supported;Feet supported Sitting balance-Leahy Scale: Fair                                       Pertinent Vitals/Pain Pain Assessment: No/denies pain    Home Living Family/patient expects to be discharged to:: Private residence Living Arrangements: Spouse/significant other Available Help at Discharge: Family;Available PRN/intermittently Type of Home: Apartment Home Access: Stairs to enter Entrance Stairs-Rails: Psychiatric nurse of Steps: flight Home Layout: One level Home Equipment: None Additional Comments: Per daughter report, pt's wife broke wrist recently and is unable to provide physical assist.  Pt had been home for 2 1/2 hours after discharge from [facility].  Daughter reports HH was supposed to come out to apt next day but she had to cancel since he was re-admitted.     Prior Function Level of Independence: Needs assistance   Gait / Transfers Assistance Needed: Daughter reports pt has been ambulating without device at [facility].   ADL's / Homemaking Assistance Needed: Had been discharged from [facility] rehab. Daughter is unsure how well he has been doing with ADLs over past 1-2 weeks.  Comments: Independent prior to STEMI in June 2020.  Pt has been at [facility] for rehab and had been discharge home to his apt to live with wife.      Hand Dominance        Extremity/Trunk Assessment   Upper Extremity Assessment Upper Extremity Assessment: Generalized weakness    Lower Extremity Assessment Lower Extremity Assessment: Generalized weakness       Communication   Communication: HOH  Cognition  Arousal/Alertness: Awake/alert Behavior During Therapy: WFL for tasks assessed/performed Overall Cognitive Status: Impaired/Different from baseline Area of Impairment: Memory;Following commands;Problem solving                     Memory: Decreased short-term memory Following Commands: Follows one step commands with increased time;Follows multi-step commands inconsistently;Follows one step commands consistently     Problem Solving: Slow processing;Decreased initiation;Difficulty sequencing;Requires verbal cues;Requires tactile cues General Comments: Pt tangential in conversation.  After multiple prompts, pt was able to recall events of past few weeks and able to recall reason for current admission.       General Comments      Exercises     Assessment/Plan    PT Assessment Patient needs continued PT services  PT Problem List Decreased cognition;Decreased safety awareness;Decreased knowledge of precautions;Decreased knowledge of use of DME;Decreased mobility;Decreased balance;Decreased strength;Decreased activity tolerance       PT Treatment Interventions DME instruction;Gait training;Therapeutic activities;Functional mobility training;Stair training;Therapeutic exercise;Balance training;Patient/family education;Cognitive remediation    PT Goals (Current goals can be found in the Care Plan section)  Acute Rehab PT Goals Patient Stated Goal: to go home PT Goal Formulation: With patient Time For Goal Achievement: 08/28/18 Potential to Achieve Goals: Good    Frequency Min 2X/week   Barriers to discharge Decreased caregiver support      Co-evaluation               AM-PAC PT "6 Clicks" Mobility  Outcome Measure Help needed turning from your back to your side while in a flat bed without using bedrails?: None Help needed moving from lying on your back to sitting on the side of a flat bed without using bedrails?: None Help needed moving to and from a bed to a chair  (including a wheelchair)?: A Little Help needed standing up from a chair using your arms (e.g., wheelchair or bedside chair)?: A Little Help needed to walk in hospital room?: A Little Help needed climbing 3-5 steps with a railing? : A Lot 6 Click Score: 19    End of Session Equipment Utilized During Treatment: Gait belt Activity Tolerance: Patient tolerated treatment well Patient left: in chair;with call bell/phone within reach;with chair alarm set Nurse Communication: Mobility status PT Visit Diagnosis: Unsteadiness on feet (R26.81);Muscle weakness (generalized) (M62.81);Difficulty in walking, not elsewhere classified (R26.2)    Time: 0539-7673 PT Time Calculation (min) (ACUTE ONLY): 25 min   Charges:   PT Evaluation $PT Eval Moderate Complexity: 1 Mod PT Treatments $Gait Training: 8-22 mins        Reinaldo Berber, PT, DPT Acute Rehabilitation Services Pager: [phone] Office: [phone]    Reinaldo Berber 08/14/2018, 5:38 PM

## 2018-08-15 LAB — BASIC METABOLIC PANEL
Anion gap: 6 (ref 5–15)
BUN: 6 mg/dL — ABNORMAL LOW (ref 8–23)
CO2: 21 mmol/L — ABNORMAL LOW (ref 22–32)
Calcium: 9.1 mg/dL (ref 8.9–10.3)
Chloride: 110 mmol/L (ref 98–111)
Creatinine, Ser: 0.95 mg/dL (ref 0.61–1.24)
GFR calc Af Amer: >60 mL/min (ref >60)
GFR calc non Af Amer: >60 mL/min (ref >60)
Glucose, Bld: 88 mg/dL (ref 70–99)
Potassium: 3.4 mmol/L — ABNORMAL LOW (ref 3.5–5.1)
Sodium: 137 mmol/L (ref 135–145)

## 2018-08-15 LAB — CBC
HCT: 45.7 % (ref 39.0–52.0)
Hemoglobin: 15.6 g/dL (ref 13.0–17.0)
MCH: 29.3 pg (ref 26.0–34.0)
MCHC: 34.1 g/dL (ref 30.0–36.0)
MCV: 85.9 fL (ref 80.0–100.0)
Platelets: 120 10*3/uL — ABNORMAL LOW (ref 150–400)
RBC: 5.32 MIL/uL (ref 4.22–5.81)
RDW: 12.5 % (ref 11.5–15.5)
WBC: 5.9 10*3/uL (ref 4.0–10.5)
nRBC: 0 % (ref 0.0–0.2)

## 2018-08-15 MED ORDER — MIDODRINE HCL 5 MG PO TABS
5.0000 mg | ORAL_TABLET | Freq: Three times a day (TID) | ORAL | Status: DC
  Administered 2018-08-15 – 2018-08-19 (×12): 5 mg via ORAL
  Filled 2018-08-15 (×12): qty 1

## 2018-08-15 MED ORDER — POTASSIUM CHLORIDE CRYS ER 20 MEQ PO TBCR
40.0000 meq | EXTENDED_RELEASE_TABLET | Freq: Once | ORAL | Status: AC
  Administered 2018-08-15: 40 meq via ORAL
  Filled 2018-08-15: qty 2

## 2018-08-15 NOTE — TOC Initial Note (Signed)
Transition of Care [facility]) - Initial/Assessment Note    Patient Details  Name: Austin Mora MRN: [id number] Date of Birth: 18-Apr-1945  [facility]) CM/SW Contact:    Gelene Mink, [place] Phone Number: 08/15/2018, 10:50 AM  Clinical Narrative:             CSW called and spoke with the patient's daughter, Mancel Parsons. CSW gave her the therapy recommendation. Amy stated that her father would not have 24/7 supervision at home. She stated that her dad and mother live in apartment in [place]. She stated that she lives at [facility]. Amy stated that her mother fell and broke her hand and was staying with her while her dad was in the hospital back in June then went to rehab. Amy stated that her mother can not assist her father. She stated that he would have to go back to rehab.   She is concerned about her dad going back to rehab. She stated he was only at [facility] for one week before was discharged then he fell and ended back up in the hospital.     Amy stated that she was interested in her father going to inpatient rehab. CSW stated that the last time, insurance did not authorize him to go but she could have the doctor put in a consult. CSW stated that she could not guarantee that the patient would be admitted to CIR. CSW obtained permission to complete the FL2 note and fax the patient out to [place].    CSW will continue to follow and assist with disposition.    Expected Discharge Plan: Skilled Nursing Facility(Daughter also wants her father to be evaluated by CIR) Barriers to Discharge: Ship broker, Continued Medical Work up   Patient Goals and CMS Choice Patient states their goals for this hospitalization and ongoing recovery are:: Pt daughter stated that she would like her father to receive rehab either at [facility] or SNF CMS Medicare.gov Compare Post Acute Care list provided to:: Patient Represenative (must comment) Choice offered to / list presented to :  Adult Children  Expected Discharge Plan and Services Expected Discharge Plan: Skilled Nursing Facility(Daughter also wants her father to be evaluated by CIR) In-house Referral: Clinical Social Work Discharge Planning Services: NA Post Acute Care Choice: [place] Living arrangements for the past 2 months: Apartment                 DME Arranged: N/A DME Agency: NA       HH Arranged: NA [place] Agency: NA        Prior Living Arrangements/Services Living arrangements for the past 2 months: Apartment Lives with:: Spouse Patient language and need for interpreter reviewed:: No Do you feel safe going back to the place where you live?: Yes      Need for Family Participation in Patient Care: Yes (Comment) Care giver support system in place?: Yes (comment)   Criminal Activity/Legal Involvement Pertinent to Current Situation/Hospitalization: No - Comment as needed  Activities of Daily Living Home Assistive Devices/Equipment: Dentures (specify type), Eyeglasses ADL Screening (condition at time of admission) Patient's cognitive ability adequate to safely complete daily activities?: Yes Is the patient deaf or have difficulty hearing?: No Does the patient have difficulty seeing, even when wearing glasses/contacts?: No Does the patient have difficulty concentrating, remembering, or making decisions?: Yes Patient able to express need for assistance with ADLs?: Yes Does the patient have difficulty dressing or bathing?: Yes Independently performs ADLs?: No Communication: Independent  Dressing (OT): Needs assistance Is this a change from baseline?: Pre-admission baseline Grooming: Independent Feeding: Independent Bathing: Needs assistance Is this a change from baseline?: Pre-admission baseline Toileting: Needs assistance Is this a change from baseline?: Pre-admission baseline In/Out Bed: Needs assistance Is this a change from baseline?: Pre-admission baseline Walks in Home:  Needs assistance Is this a change from baseline?: Pre-admission baseline Does the patient have difficulty walking or climbing stairs?: Yes Weakness of Legs: Both Weakness of Arms/Hands: None  Permission Sought/Granted Permission sought to share information with : Case Manager Permission granted to share information with : Yes, Verbal Permission Granted  Share Information with NAME: [place] granted to share info w AGENCY: All SNF  Permission granted to share info w Relationship: Daughter  Permission granted to share info w Contact Information: [phone]  Emotional Assessment Appearance:: Appears stated age Attitude/Demeanor/Rapport: Unable to Assess Affect (typically observed): Unable to Assess Orientation: : Oriented to Self, Oriented to Place Alcohol / Substance Use: Not Applicable Psych Involvement: No (comment)  Admission diagnosis:  Hypotension, unspecified hypotension type [I95.9] Syncope, unspecified syncope type [R55] Syncope [R55] Patient Active Problem List   Diagnosis Date Noted  . Hypotension   . Orthostatic hypotension   . Syncope 08/12/2018  . ARF (acute renal failure) ([place]) 08/12/2018  . Ischemic cardiomyopathy 08/05/2018  . Hyperlipidemia 08/05/2018  . Acute ST elevation myocardial infarction (STEMI) involving left anterior descending (LAD) coronary artery ([place]) 07/28/2018  . ST elevation myocardial infarction involving left anterior descending (LAD) coronary artery ([place])   . 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 Parkinson's disease ([place]) 07/16/2018  . Memory disorder 07/01/2018   PCP:  Leanna Battles, MD Pharmacy:   [facility], [place] [address] Phone: [phone] Fax: [phone]  [facility] [place]), [place] - [address] [place]) [place] [address] Phone: [phone] Fax:  [phone]     Social Determinants of Health (SDOH) Interventions    Readmission Risk Interventions No flowsheet data found.

## 2018-08-15 NOTE — Progress Notes (Signed)
PROGRESS NOTE    Austin Mora  KCL:[id number] DOB: 03-14-1945 DOA: 08/12/2018 PCP: Leanna Battles, MD    Brief Narrative: HPI per Dr. Donnella Bi is a 73 y.o. male with history of recent ST elevation MI status post stent placement, ischemic cardiomyopathy was brought to the ER after patient had a fall at the living facility.  Per report patient had some argument with family and was turning back and suddenly felt.  It is not clear if patient lost consciousness but per family patient has poor memory and history is not very reliable.  EMS was called and patient blood pressure initially was found to be 80 systolic.  Patient denies any dizziness prior to the fall any chest pain shortness of breath nausea vomiting or diarrhea.  ED Course: In the ER patient blood pressure was more than 494 systolic with the labs showing increased creatinine of 1.37 from baseline of 1.02 last week.  EKG was showing no new changes with old T wave inversion in anterolateral leads.  Patient did not have any chest pain CT it is unremarkable COVID-19 test was negative.  Later when checking orthostatic changes patient was clearly orthostatic blood pressure dropping to 60s on standing.  Patient admitted for possible syncope fall and orthostatic changes.  Assessment & Plan:   Principal Problem:   Syncope Active Problems:   Memory disorder   Parkinson's disease ([place])   Ischemic cardiomyopathy   ARF (acute renal failure) (HCC)   Hypotension   Orthostatic hypotension  1 orthostatic hypotension/fall/probable syncope Patient noted to be significantly orthostatic on admission.  Patient currently on telemetry.  Patient noted to be still orthostatic on 08/14/2018 with orthostasis despite fluid boluses.  Patient with recent non-ST elevated MI with a cardiomyopathy with a EF of 30 to 35% and as such with patient's possible syncope with orthostasis/hypotension concern for possible cardiac etiology.  Patient with no  signs or symptoms of infection.  Patient afebrile.  Patient with a normal white count.  Random cortisol of 15.9.  Patient with no overt bleeding hemoglobin currently stable at 15.6.  Cardiology has been consulted and were following.  Coreg discontinued per cardiology.  ARB and spironolactone also held during this hospitalization likely will not be resumed on discharge until follow-up with cardiology.  TED hose were ordered.  Patient subsequently started on midodrine 2.5 mg 3 times daily with some improvement with orthostasis.  Increase midodrine to 5 mg 3 times daily.  Follow.   2.  Acute kidney injury Questionable etiology.  Likely secondary to a prerenal azotemia as patient noted to have orthostatic hypotension in the setting of ARB, spironolactone.  Patient's ARB was held on admission.  Patient was still orthostatic and as such she spironolactone and Coreg were discontinued.  Patient hydrated with IV fluids.  Renal function improved and creatinine was down to 0.95 on 08/15/2018.  Saline lock IV fluids.  Follow.    3.  Recent STEMI(07/31/2018)/ischemic cardiomyopathy EF 30 to 35% Patient with recent STEMI status post stent placement on aspirin and Brilinta and Coreg and Lipitor.  Patient also noted to be on spironolactone and ARB.  Spironolactone ARB discontinued secondary to acute kidney injury as well as orthostatic hypotension.  Continue aspirin and Brilinta and Lipitor.  Coreg discontinued per cardiology secondary to ongoing orthostasis.  Will need to follow-up with cardiology in the outpatient setting post discharge for further management of his cardiac medications of all of them have been discontinued due to orthostasis.  4.  Memory issues Continue Abilify and Lexapro.  Outpatient follow-up.  5.  Hyperlipidemia Continue statin.  6.  Hypertension ARB, spironolactone and Coreg have been discontinued due to problem #1. Follow.    DVT prophylaxis: Lovenox Code Status: Full Family  Communication: Updated patient. Updated daughter via telephone. Disposition Plan: SNF versus CIR.    Consultants:   Cardiology: Dr. Marlou Porch 08/13/2018    Procedures:   CT head without contrast 08/12/2018  Antimicrobials:  None   Subjective: Patient sitting up in the chair just finished eating his lunch.  Denies any chest pain or shortness of breath.  Denies any dizziness.   Objective: Vitals:   08/14/18 1820 08/14/18 2205 08/15/18 0632 08/15/18 0913  BP: 122/67 117/67 (!) 112/56 102/72  Pulse: 61 64 (!) 57 60  Resp: 18 16 15 16   Temp: (!) 97.4 F (36.3 C) 97.6 F (36.4 C) 97.9 F (36.6 C) 97.8 F (36.6 C)  TempSrc: Oral Oral Oral Oral  SpO2: 97% 99% 98% 97%  Weight:  75.3 kg    Height:        Intake/Output Summary (Last 24 hours) at 08/15/2018 1253 Last data filed at 08/15/2018 0900 Gross per 24 hour  Intake 600 ml  Output 300 ml  Net 300 ml   Filed Weights   08/13/18 2018 08/14/18 0453 08/14/18 2205  Weight: 75.7 kg 75.7 kg 75.3 kg    Examination:  General exam: NAD Respiratory system: Lungs clear to auscultation bilaterally.  No wheezes, no crackles, no rhonchi.  Normal respiratory effort.  Cardiovascular system: RRR no murmurs rubs or gallops.  No JVD.  No lower extremity edema.   Gastrointestinal system: Abdomen is soft, nontender, nondistended, positive bowel sounds.  No rebound.  No guarding.  Central nervous system: Alert and oriented. No focal neurological deficits. Extremities: Symmetric 5 x 5 power. Skin: No rashes, lesions or ulcers Psychiatry: Judgement and insight appear poor to fair. Mood & affect appropriate.     Data Reviewed: I have personally reviewed following labs and imaging studies  CBC: Recent Labs  Lab 08/12/18 1713 08/13/18 0351 08/14/18 0430 08/15/18 0434  WBC 7.7 8.2 6.5 5.9  NEUTROABS 5.5  --   --   --   HGB 16.2 15.4 14.9 15.6  HCT 46.9 45.7 43.4 45.7  MCV 85.9 87.2 86.5 85.9  PLT 156 133* 120* 120*   Basic  Metabolic Panel: Recent Labs  Lab 08/12/18 1713 08/13/18 0351 08/14/18 0430 08/15/18 0434  NA 136 136 138 137  K 3.6 3.6 3.5 3.4*  CL 105 104 109 110  CO2 20* 25 22 21*  GLUCOSE 132* 82 88 88  BUN 14 12 8  6*  CREATININE 1.37* 1.37* 1.09 0.95  CALCIUM 9.6 9.3 8.9 9.1   GFR: Estimated Creatinine Clearance: 68.2 mL/min (by C-G formula based on SCr of 0.95 mg/dL). Liver Function Tests: Recent Labs  Lab 08/12/18 1713  AST 27  ALT 31  ALKPHOS 65  BILITOT 1.0  PROT 6.5  ALBUMIN 4.0   No results for input(s): LIPASE, AMYLASE in the last 168 hours. No results for input(s): AMMONIA in the last 168 hours. Coagulation Profile: No results for input(s): INR, PROTIME in the last 168 hours. Cardiac Enzymes: No results for input(s): CKTOTAL, CKMB, CKMBINDEX, TROPONINI in the last 168 hours. BNP (last 3 results) No results for input(s): PROBNP in the last 8760 hours. HbA1C: No results for input(s): HGBA1C in the last 72 hours. CBG: Recent Labs  Lab 08/12/18 2219 08/13/18 9323  GLUCAP 91 84   Lipid Profile: No results for input(s): CHOL, HDL, LDLCALC, TRIG, CHOLHDL, LDLDIRECT in the last 72 hours. Thyroid Function Tests: No results for input(s): TSH, T4TOTAL, FREET4, T3FREE, THYROIDAB in the last 72 hours. Anemia Panel: No results for input(s): VITAMINB12, FOLATE, FERRITIN, TIBC, IRON, RETICCTPCT in the last 72 hours. Sepsis Labs: No results for input(s): PROCALCITON, LATICACIDVEN in the last 168 hours.  Recent Results (from the past 240 hour(s))  SARS Coronavirus 2 (CEPHEID - Performed in [place] hospital lab), Hosp Order     Status: None   Collection Time: 08/12/18  7:21 PM   Specimen: Nasopharyngeal Swab  Result Value Ref Range Status   SARS Coronavirus 2 NEGATIVE NEGATIVE Final    Comment: (NOTE) If result is NEGATIVE SARS-CoV-2 target nucleic acids are NOT DETECTED. The SARS-CoV-2 RNA is generally detectable in upper and lower  respiratory specimens during the  acute phase of infection. The lowest  concentration of SARS-CoV-2 viral copies this assay can detect is 250  copies / mL. A negative result does not preclude SARS-CoV-2 infection  and should not be used as the sole basis for treatment or other  patient management decisions.  A negative result may occur with  improper specimen collection / handling, submission of specimen other  than nasopharyngeal swab, presence of viral mutation(s) within the  areas targeted by this assay, and inadequate number of viral copies  (<250 copies / mL). A negative result must be combined with clinical  observations, patient history, and epidemiological information. If result is POSITIVE SARS-CoV-2 target nucleic acids are DETECTED. The SARS-CoV-2 RNA is generally detectable in upper and lower  respiratory specimens dur ing the acute phase of infection.  Positive  results are indicative of active infection with SARS-CoV-2.  Clinical  correlation with patient history and other diagnostic information is  necessary to determine patient infection status.  Positive results do  not rule out bacterial infection or co-infection with other viruses. If result is PRESUMPTIVE POSTIVE SARS-CoV-2 nucleic acids MAY BE PRESENT.   A presumptive positive result was obtained on the submitted specimen  and confirmed on repeat testing.  While 2019 novel coronavirus  (SARS-CoV-2) nucleic acids may be present in the submitted sample  additional confirmatory testing may be necessary for epidemiological  and / or clinical management purposes  to differentiate between  SARS-CoV-2 and other Sarbecovirus currently known to infect humans.  If clinically indicated additional testing with an alternate test  methodology [phone]) is advised. The SARS-CoV-2 RNA is generally  detectable in upper and lower respiratory sp ecimens during the acute  phase of infection. The expected result is Negative. Fact Sheet for Patients:   StrictlyIdeas.no Fact Sheet for Healthcare Providers: BankingDealers.co.za This test is not yet approved or cleared by the [place] FDA and has been authorized for detection and/or diagnosis of SARS-CoV-2 by FDA under an Emergency Use Authorization (EUA).  This EUA will remain in effect (meaning this test can be used) for the duration of the COVID-19 declaration under Section 564(b)(1) of the Act, 21 U.S.C. section 360bbb-3(b)(1), unless the authorization is terminated or revoked sooner. Performed at [facility], [place] [address] [place],  [address]          Radiology Studies: No results found.      Scheduled Meds:  ARIPiprazole  5 mg Oral Daily   aspirin  81 mg Oral Daily   enoxaparin (LOVENOX) injection  40 mg Subcutaneous Q24H   escitalopram  10  mg Oral Daily   midodrine  5 mg Oral TID WC   pantoprazole  40 mg Oral Daily   rosuvastatin  40 mg Oral QPM   ticagrelor  90 mg Oral BID   Continuous Infusions:   LOS: 2 days    Time spent: 35 minutes    Irine Seal, MD [facility]  If 7PM-7AM, please contact night-coverage [email or web address] 08/15/2018, 12:53 PM

## 2018-08-15 NOTE — Plan of Care (Signed)
  Problem: Clinical Measurements: Goal: Diagnostic test results will improve Outcome: Progressing   

## 2018-08-15 NOTE — NC FL2 (Signed)
[place] MEDICAID FL2 LEVEL OF CARE SCREENING TOOL     IDENTIFICATION  Patient Name: Austin Mora Birthdate: 10-24-1945 Sex: male Admission Date (Current Location): 08/12/2018  [facility] and [place] Number:  Herbalist and Address:  [facility], [place] [address] [place], [place] [address]      Provider Number: [id number]  Attending Physician Name and Address:  Eugenie Filler, MD  Relative Name and Phone Number:  Mancel Parsons Daughter, [phone]    Current Level of Care: Hospital Recommended Level of Care: [place] Prior Approval Number:    Date Approved/Denied:   PASRR Number: [id number]  Discharge Plan: SNF    Current Diagnoses: Patient Active Problem List   Diagnosis Date Noted  . Hypotension   . Orthostatic hypotension   . Syncope 08/12/2018  . ARF (acute renal failure) ([place]) 08/12/2018  . Ischemic cardiomyopathy 08/05/2018  . Hyperlipidemia 08/05/2018  . Acute ST elevation myocardial infarction (STEMI) involving left anterior descending (LAD) coronary artery ([place]) 07/28/2018  . ST elevation myocardial infarction involving left anterior descending (LAD) coronary artery ([place])   . 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 Parkinson's disease ([place]) 07/16/2018  . Memory disorder 07/01/2018    Orientation RESPIRATION BLADDER Height & Weight     Self, Place  Normal Continent Weight: 166 lb 0.1 oz (75.3 kg) Height:  5\' 9"  (175.3 cm)  BEHAVIORAL SYMPTOMS/MOOD NEUROLOGICAL BOWEL NUTRITION STATUS      Continent Diet(heart healthy/cardiac diet, thin liquids, fluid restriction 123ml)  AMBULATORY STATUS COMMUNICATION OF NEEDS Skin   Limited Assist Verbally Skin abrasions, Bruising(brusing on arm/leg and skin tear on ankle)                       Personal Care Assistance Level of Assistance  Bathing, Feeding, Dressing, Total care Bathing Assistance: Limited assistance Feeding assistance:  Independent Dressing Assistance: Limited assistance Total Care Assistance: Limited assistance   Functional Limitations Info  Sight, Hearing, Speech Sight Info: Impaired Hearing Info: Impaired Speech Info: Adequate    SPECIAL CARE FACTORS FREQUENCY  PT (By licensed PT), OT (By licensed OT)     PT Frequency: 5x/wk OT Frequency: 5x/wk            Contractures Contractures Info: Not present    Additional Factors Info  Code Status, Allergies, Psychotropic Code Status Info: Full Code Allergies Info: Aricept (Donepezil Hcl), Sulfa Antibiotics Psychotropic Info: xanax .05mg  PRN & Abilfy 5mg  daily         Current Medications (08/15/2018):  This is the current hospital active medication list Current Facility-Administered Medications  Medication Dose Route Frequency Provider Last Rate Last Dose  . acetaminophen (TYLENOL) tablet 650 mg  650 mg Oral Q6H PRN Rise Patience, MD   650 mg at 08/14/18 2153   Or  . acetaminophen (TYLENOL) suppository 650 mg  650 mg Rectal Q6H PRN Rise Patience, MD      . ALPRAZolam Duanne Moron) tablet 0.5 mg  0.5 mg Oral Daily PRN Rise Patience, MD   0.5 mg at 08/14/18 2154  . ARIPiprazole (ABILIFY) tablet 5 mg  5 mg Oral Daily Rise Patience, MD   5 mg at 08/15/18 0933  . aspirin chewable tablet 81 mg  81 mg Oral Daily Rise Patience, MD   81 mg at 08/15/18 0932  . enoxaparin (LOVENOX) injection 40 mg  40 mg Subcutaneous Q24H Rise Patience, MD   40 mg  at 08/14/18 2155  . escitalopram (LEXAPRO) tablet 10 mg  10 mg Oral Daily Rise Patience, MD   10 mg at 08/15/18 0933  . midodrine (PROAMATINE) tablet 2.5 mg  2.5 mg Oral TID WC Eugenie Filler, MD   2.5 mg at 08/15/18 0932  . nitroGLYCERIN (NITROSTAT) SL tablet 0.4 mg  0.4 mg Sublingual Q5 min PRN Rise Patience, MD      . ondansetron [facility]) tablet 4 mg  4 mg Oral Q6H PRN Rise Patience, MD       Or  . ondansetron [facility]) injection 4 mg  4 mg  Intravenous Q6H PRN Rise Patience, MD      . pantoprazole (PROTONIX) EC tablet 40 mg  40 mg Oral Daily Rise Patience, MD   40 mg at 08/15/18 0933  . potassium chloride SA (K-DUR) CR tablet 40 mEq  40 mEq Oral Once Eugenie Filler, MD      . rosuvastatin (CRESTOR) tablet 40 mg  40 mg Oral QPM Rise Patience, MD   40 mg at 08/14/18 1758  . ticagrelor (BRILINTA) tablet 90 mg  90 mg Oral BID Rise Patience, MD   90 mg at 08/15/18 0932   Facility-Administered Medications Ordered in Other Encounters  Medication Dose Route Frequency Provider Last Rate Last Dose  . 0.9 %  sodium chloride infusion    Continuous PRN Troy Sine, MD 75 mL/hr at 07/29/18 0003 500 mL at 07/29/18 0003  . ticagrelor (BRILINTA) tablet    PRN Troy Sine, MD   90 mg at 07/29/18 1103     Discharge Medications: Please see discharge summary for a list of discharge medications.  Relevant Imaging Results:  Relevant Lab Results:   Additional Information SSN: [id number]  Philippa Chester Raphael Espe, LCSWA

## 2018-08-15 NOTE — Progress Notes (Signed)
Occupational Therapy Treatment Patient Details Name: Austin Mora MRN: [id number] DOB: 24-Nov-1945 Today's Date: 08/15/2018    History of present illness Pt admitted s/p sycnopal event with fall at home on 08/12/18.  Pt has just been discharged home from SNF [facility]) and had been home 2 1/2 hours per daughter report. Pt with history of CAD with recent STEMI on 07/28/2018 s/p DES to LAD, ischemic cardiomyopathy with EF of 30-35%, hypertension, hyperlipidemia, type 2 diabetes mellitus, GERD, and memory disorder.   OT comments  Pt. Seen for skilled OT session.  Physically does not require much assistance, staying between min  Guard assist and min a.  Concerns for slow processing and self described confusion.  If 24/7 not available would still stand by recommendation for SNF for pt. Safety.    Follow Up Recommendations  Home health OT;Supervision/Assistance - 24 hour;SNF    Equipment Recommendations  3 in 1 bedside commode    Recommendations for Other Services      Precautions / Restrictions Precautions Precautions: Fall       Mobility Bed Mobility Overal bed mobility: Needs Assistance Bed Mobility: Supine to Sit     Supine to sit: Supervision     General bed mobility comments: increased time, supervision for safety-cont ot ask how do i do this, what do i do next. encouraged him to "just get out of bed and not think about the steps" when he did that he was able to initiate the task without any assistance  Transfers Overall transfer level: Needs assistance Equipment used: Rolling walker (2 wheeled) Transfers: Sit to/from [facility] Sit to Stand: Min assist Stand pivot transfers: Min assist            Balance                                           ADL either performed or assessed with clinical judgement   ADL Overall ADL's : Needs assistance/impaired                         Toilet Transfer: Minimal assistance;RW;Grab  Control and instrumentation engineer- Clothing Manipulation and Hygiene: Set up;Sitting/lateral lean       Functional mobility during ADLs: Minimal assistance;Cueing for sequencing;Rolling walker General ADL Comments: moves slow, requires intermittent redirection for rw management     Vision       Perception     Praxis      Cognition Arousal/Alertness: Awake/alert Behavior During Therapy: WFL for tasks assessed/performed Overall Cognitive Status: Impaired/Different from baseline Area of Impairment: Memory                     Memory: Decreased short-term memory Following Commands: Follows one step commands with increased time;Follows multi-step commands inconsistently;Follows one step commands consistently Safety/Judgement: Decreased awareness of deficits;Decreased awareness of safety   Problem Solving: Slow processing;Decreased initiation;Difficulty sequencing;Requires verbal cues;Requires tactile cues General Comments: pt. reports feeling confused today. expresses concern for not knowing who any of [place] are and what we are doing. continueing to ask if my name was on the board, who is on the board, who are any of [place] and why are we not his usual doctors. how did we know what medications he needs.  i reviewed at length the board and what information is on theboard.  also reviewed that  we are all color coordinated with what our jobs. are. provided support and reviewed it is very difficult beacuse the same people in each dept. do not work every day.        Exercises     Shoulder Instructions       General Comments      Pertinent Vitals/ Pain       Pain Assessment: Faces Faces Pain Scale: Hurts little more Pain Location: "im so sore" (did not specify where)  Home Living                                          Prior Functioning/Environment              Frequency  Min 2X/week        Progress Toward Goals  OT Goals(current goals can now  be found in the care plan section)  Progress towards OT goals: Progressing toward goals     Plan Discharge plan remains appropriate    Co-evaluation                 AM-PAC OT "6 Clicks" Daily Activity     Outcome Measure   Help from another person eating meals?: None Help from another person taking care of personal grooming?: A Little Help from another person toileting, which includes using toliet, bedpan, or urinal?: A Little Help from another person bathing (including washing, rinsing, drying)?: A Little Help from another person to put on and taking off regular upper body clothing?: None Help from another person to put on and taking off regular lower body clothing?: A Little 6 Click Score: 20    End of Session Equipment Utilized During Treatment: Gait belt;Rolling walker  OT Visit Diagnosis: Unsteadiness on feet (R26.81);Other symptoms and signs involving cognitive function;Muscle weakness (generalized) (M62.81)   Activity Tolerance Patient tolerated treatment well   Patient Left in chair;with call bell/phone within reach;with chair alarm set   Nurse Communication Other (comment)(spoke with rn and cna about pts concerns and descriptions of feeling confused about his providers. asked them to cont. to introduce themseles and explain what they are going to be doing)        Time: 8882-8003 OT Time Calculation (min): 31 min  Charges: OT General Charges $OT Visit: 1 Visit OT Treatments $Self Care/Home Management : 23-37 mins   Janice Coffin, COTA/L 08/15/2018, 9:14 AM

## 2018-08-15 NOTE — Plan of Care (Signed)
  Problem: Education: Goal: Knowledge of General Education information will improve Description: Including pain rating scale, medication(s)/side effects and non-pharmacologic comfort measures Outcome: Completed/Met   Problem: Health Behavior/Discharge Planning: Goal: Ability to manage health-related needs will improve Outcome: Completed/Met   Problem: Clinical Measurements: Goal: Ability to maintain clinical measurements within normal limits will improve Outcome: Completed/Met Goal: Cardiovascular complication will be avoided Outcome: Completed/Met   Problem: Nutrition: Goal: Adequate nutrition will be maintained Outcome: Completed/Met   Problem: Coping: Goal: Level of anxiety will decrease Outcome: Completed/Met   Problem: Pain Managment: Goal: General experience of comfort will improve Outcome: Completed/Met   Problem: Safety: Goal: Ability to remain free from injury will improve Outcome: Completed/Met   Problem: Skin Integrity: Goal: Risk for impaired skin integrity will decrease Outcome: Completed/Met

## 2018-08-16 LAB — CBC
HCT: 48.4 % (ref 39.0–52.0)
Hemoglobin: 16.2 g/dL (ref 13.0–17.0)
MCH: 29.1 pg (ref 26.0–34.0)
MCHC: 33.5 g/dL (ref 30.0–36.0)
MCV: 86.9 fL (ref 80.0–100.0)
Platelets: 130 10*3/uL — ABNORMAL LOW (ref 150–400)
RBC: 5.57 MIL/uL (ref 4.22–5.81)
RDW: 12.6 % (ref 11.5–15.5)
WBC: 6.1 10*3/uL (ref 4.0–10.5)
nRBC: 0 % (ref 0.0–0.2)

## 2018-08-16 LAB — BASIC METABOLIC PANEL
Anion gap: 8 (ref 5–15)
BUN: 6 mg/dL — ABNORMAL LOW (ref 8–23)
CO2: 20 mmol/L — ABNORMAL LOW (ref 22–32)
Calcium: 9.6 mg/dL (ref 8.9–10.3)
Chloride: 110 mmol/L (ref 98–111)
Creatinine, Ser: 1.03 mg/dL (ref 0.61–1.24)
GFR calc Af Amer: >60 mL/min (ref >60)
GFR calc non Af Amer: >60 mL/min (ref >60)
Glucose, Bld: 88 mg/dL (ref 70–99)
Potassium: 3.6 mmol/L (ref 3.5–5.1)
Sodium: 138 mmol/L (ref 135–145)

## 2018-08-16 MED ORDER — POTASSIUM CHLORIDE CRYS ER 20 MEQ PO TBCR
40.0000 meq | EXTENDED_RELEASE_TABLET | Freq: Once | ORAL | Status: AC
  Administered 2018-08-16: 40 meq via ORAL
  Filled 2018-08-16: qty 2

## 2018-08-16 NOTE — Progress Notes (Signed)
PROGRESS NOTE    Austin Mora  ZSW:[id number] DOB: 02-Jun-1945 DOA: 08/12/2018 PCP: Leanna Battles, MD    Brief Narrative: HPI per Dr. Donnella Bi is a 73 y.o. male with history of recent ST elevation MI status post stent placement, ischemic cardiomyopathy was brought to the ER after patient had a fall at the living facility.  Per report patient had some argument with family and was turning back and suddenly felt.  It is not clear if patient lost consciousness but per family patient has poor memory and history is not very reliable.  EMS was called and patient blood pressure initially was found to be 80 systolic.  Patient denies any dizziness prior to the fall any chest pain shortness of breath nausea vomiting or diarrhea.  ED Course: In the ER patient blood pressure was more than 322 systolic with the labs showing increased creatinine of 1.37 from baseline of 1.02 last week.  EKG was showing no new changes with old T wave inversion in anterolateral leads.  Patient did not have any chest pain CT it is unremarkable COVID-19 test was negative.  Later when checking orthostatic changes patient was clearly orthostatic blood pressure dropping to 60s on standing.  Patient admitted for possible syncope fall and orthostatic changes.  Assessment & Plan:   Principal Problem:   Syncope Active Problems:   Memory disorder   Parkinson's disease ([place])   Ischemic cardiomyopathy   ARF (acute renal failure) (HCC)   Hypotension   Orthostatic hypotension  1 orthostatic hypotension/fall/probable syncope Patient noted to be significantly orthostatic on admission.  Patient currently on telemetry.  Patient noted to be still orthostatic on 08/14/2018 with orthostasis despite fluid boluses.  Patient with recent non-ST elevated MI with a cardiomyopathy with a EF of 30 to 35% and as such with patient's possible syncope with orthostasis/hypotension concern for possible cardiac etiology.  Patient with no  signs or symptoms of infection.  Patient afebrile.  Patient with a normal white count.  Random cortisol of 15.9.  Patient with no overt bleeding hemoglobin currently stable at 16.2.  Cardiology has been consulted and were following.  Coreg discontinued per cardiology.  ARB and spironolactone also held during this hospitalization likely will not be resumed on discharge until follow-up with cardiology.  TED hose were ordered.  Patient currently on midodrine 5 mg 3 times daily with improvement with orthostasis.  Outpatient follow-up.   2.  Acute kidney injury Questionable etiology.  Likely secondary to a prerenal azotemia as patient noted to have orthostatic hypotension in the setting of ARB, spironolactone.  Patient's ARB was held on admission.  Patient was still orthostatic and as such she spironolactone and Coreg were discontinued.  Patient hydrated with IV fluids.  Renal function improved and creatinine was down to 1.03.  Follow.     3.  Recent STEMI(07/31/2018)/ischemic cardiomyopathy EF 30 to 35% Patient with recent STEMI status post stent placement on aspirin and Brilinta and Coreg and Lipitor.  Patient also noted to be on spironolactone and ARB.  Spironolactone ARB discontinued secondary to acute kidney injury as well as orthostatic hypotension.  Continue aspirin and Brilinta and Lipitor.  Coreg discontinued per cardiology secondary to ongoing orthostasis.  Will need to follow-up with cardiology in the outpatient setting post discharge for further management of his cardiac medications as these have been discontinued during his hospitalization due to orthostasis.   4.  Memory issues Continue Abilify and Lexapro.  Outpatient follow-up.  5.  Hyperlipidemia Stable.  Continue statin.  6.  Hypertension ARB, spironolactone and Coreg have been discontinued due to problem #1. Follow.    DVT prophylaxis: Lovenox Code Status: Full Family Communication: Updated patient. Updated daughter via  telephone. Disposition Plan: SNF versus CIR.    Consultants:   Cardiology: Dr. Marlou Porch 08/13/2018    Procedures:   CT head without contrast 08/12/2018  Antimicrobials:  None   Subjective: Patient sitting up in bed.  Denies chest pain or shortness of breath.  No dizzy spells.  Patient states he is resistant to going to a skilled nursing facility.    Objective: Vitals:   08/15/18 1821 08/15/18 2046 08/16/18 0517 08/16/18 0852  BP: (!) 147/78 119/77 114/63 126/68  Pulse: (!) 58 62 60 77  Resp: 16 16 16 16   Temp: 97.8 F (36.6 C) (!) 97.4 F (36.3 C) (!) 97.5 F (36.4 C) (!) 97.4 F (36.3 C)  TempSrc: Oral Oral Oral Oral  SpO2: 97% 96% 98% 97%  Weight:  75.4 kg    Height:        Intake/Output Summary (Last 24 hours) at 08/16/2018 1241 Last data filed at 08/16/2018 0858 Gross per 24 hour  Intake 960 ml  Output 335 ml  Net 625 ml   Filed Weights   08/14/18 0453 08/14/18 2205 08/15/18 2046  Weight: 75.7 kg 75.3 kg 75.4 kg    Examination:  General exam: NAD Respiratory system: Clear to auscultation bilaterally.  No wheezes, no crackles, no rhonchi.  Normal respiratory effort.  Cardiovascular system: Regular rate rhythm no murmurs rubs or gallops.  No JVD.  No lower extremity edema.  Gastrointestinal system: Abdomen is soft, nontender, nondistended, positive bowel sounds.  No rebound.  No guarding.  Central nervous system: Alert and oriented. No focal neurological deficits. Extremities: Symmetric 5 x 5 power. Skin: No rashes, lesions or ulcers Psychiatry: Judgement and insight appear poor to fair. Mood & affect appropriate.     Data Reviewed: I have personally reviewed following labs and imaging studies  CBC: Recent Labs  Lab 08/12/18 1713 08/13/18 0351 08/14/18 0430 08/15/18 0434 08/16/18 0328  WBC 7.7 8.2 6.5 5.9 6.1  NEUTROABS 5.5  --   --   --   --   HGB 16.2 15.4 14.9 15.6 16.2  HCT 46.9 45.7 43.4 45.7 48.4  MCV 85.9 87.2 86.5 85.9 86.9  PLT 156  133* 120* 120* 229*   Basic Metabolic Panel: Recent Labs  Lab 08/12/18 1713 08/13/18 0351 08/14/18 0430 08/15/18 0434 08/16/18 0328  NA 136 136 138 137 138  K 3.6 3.6 3.5 3.4* 3.6  CL 105 104 109 110 110  CO2 20* 25 22 21* 20*  GLUCOSE 132* 82 88 88 88  BUN 14 12 8  6* 6*  CREATININE 1.37* 1.37* 1.09 0.95 1.03  CALCIUM 9.6 9.3 8.9 9.1 9.6   GFR: Estimated Creatinine Clearance: 62.9 mL/min (by C-G formula based on SCr of 1.03 mg/dL). Liver Function Tests: Recent Labs  Lab 08/12/18 1713  AST 27  ALT 31  ALKPHOS 65  BILITOT 1.0  PROT 6.5  ALBUMIN 4.0   No results for input(s): LIPASE, AMYLASE in the last 168 hours. No results for input(s): AMMONIA in the last 168 hours. Coagulation Profile: No results for input(s): INR, PROTIME in the last 168 hours. Cardiac Enzymes: No results for input(s): CKTOTAL, CKMB, CKMBINDEX, TROPONINI in the last 168 hours. BNP (last 3 results) No results for input(s): PROBNP in the last 8760 hours. HbA1C: No results for input(s):  HGBA1C in the last 72 hours. CBG: Recent Labs  Lab 08/12/18 2219 08/13/18 0658  GLUCAP 91 84   Lipid Profile: No results for input(s): CHOL, HDL, LDLCALC, TRIG, CHOLHDL, LDLDIRECT in the last 72 hours. Thyroid Function Tests: No results for input(s): TSH, T4TOTAL, FREET4, T3FREE, THYROIDAB in the last 72 hours. Anemia Panel: No results for input(s): VITAMINB12, FOLATE, FERRITIN, TIBC, IRON, RETICCTPCT in the last 72 hours. Sepsis Labs: No results for input(s): PROCALCITON, LATICACIDVEN in the last 168 hours.  Recent Results (from the past 240 hour(s))  SARS Coronavirus 2 (CEPHEID - Performed in [place] hospital lab), Hosp Order     Status: None   Collection Time: 08/12/18  7:21 PM   Specimen: Nasopharyngeal Swab  Result Value Ref Range Status   SARS Coronavirus 2 NEGATIVE NEGATIVE Final    Comment: (NOTE) If result is NEGATIVE SARS-CoV-2 target nucleic acids are NOT DETECTED. The SARS-CoV-2 RNA is  generally detectable in upper and lower  respiratory specimens during the acute phase of infection. The lowest  concentration of SARS-CoV-2 viral copies this assay can detect is 250  copies / mL. A negative result does not preclude SARS-CoV-2 infection  and should not be used as the sole basis for treatment or other  patient management decisions.  A negative result may occur with  improper specimen collection / handling, submission of specimen other  than nasopharyngeal swab, presence of viral mutation(s) within the  areas targeted by this assay, and inadequate number of viral copies  (<250 copies / mL). A negative result must be combined with clinical  observations, patient history, and epidemiological information. If result is POSITIVE SARS-CoV-2 target nucleic acids are DETECTED. The SARS-CoV-2 RNA is generally detectable in upper and lower  respiratory specimens dur ing the acute phase of infection.  Positive  results are indicative of active infection with SARS-CoV-2.  Clinical  correlation with patient history and other diagnostic information is  necessary to determine patient infection status.  Positive results do  not rule out bacterial infection or co-infection with other viruses. If result is PRESUMPTIVE POSTIVE SARS-CoV-2 nucleic acids MAY BE PRESENT.   A presumptive positive result was obtained on the submitted specimen  and confirmed on repeat testing.  While 2019 novel coronavirus  (SARS-CoV-2) nucleic acids may be present in the submitted sample  additional confirmatory testing may be necessary for epidemiological  and / or clinical management purposes  to differentiate between  SARS-CoV-2 and other Sarbecovirus currently known to infect humans.  If clinically indicated additional testing with an alternate test  methodology [phone]) is advised. The SARS-CoV-2 RNA is generally  detectable in upper and lower respiratory sp ecimens during the acute  phase of  infection. The expected result is Negative. Fact Sheet for Patients:  StrictlyIdeas.no Fact Sheet for Healthcare Providers: BankingDealers.co.za This test is not yet approved or cleared by the [place] FDA and has been authorized for detection and/or diagnosis of SARS-CoV-2 by FDA under an Emergency Use Authorization (EUA).  This EUA will remain in effect (meaning this test can be used) for the duration of the COVID-19 declaration under Section 564(b)(1) of the Act, 21 U.S.C. section 360bbb-3(b)(1), unless the authorization is terminated or revoked sooner. Performed at [facility], [place] [address] [place], [place] [address]          Radiology Studies: No results found.      Scheduled Meds:  ARIPiprazole  5 mg Oral Daily   aspirin  81 mg Oral  Daily   enoxaparin (LOVENOX) injection  40 mg Subcutaneous Q24H   escitalopram  10 mg Oral Daily   midodrine  5 mg Oral TID WC   pantoprazole  40 mg Oral Daily   rosuvastatin  40 mg Oral QPM   ticagrelor  90 mg Oral BID   Continuous Infusions:   LOS: 3 days    Time spent: 35 minutes    Irine Seal, MD [facility]  If 7PM-7AM, please contact night-coverage [email or web address] 08/16/2018, 12:41 PM

## 2018-08-16 NOTE — Progress Notes (Signed)
Received a phone call from the patient's daughter, Amy. She gave me other facility options, [facility] and [facility]. Amy asked if her father had been evaluated by CIR, CSW stated that the consult was placed by no one has seen him yet. CSW stated it would probably be Monday once he is evaluated. CSW told Amy she would call her once he had.    CSW went ahead and faxed the patient out to [facility] and [place].   CSW will continue to follow.   Domenic Schwab, MSW, [place] Worker [facility]  [phone]

## 2018-08-17 ENCOUNTER — Telehealth: Payer: Self-pay | Admitting: Cardiology

## 2018-08-17 NOTE — Progress Notes (Signed)
Inpatient Rehab Admissions:  Inpatient Rehab Consult received. After chart review, feel pt does not have a diagnosis amenable to CIR. Pt is also Min G for 60 feet during PT evaluation. AC agrees with PT and OT recommendation for SNF placement. AC will not pursue CIR at this time. AC will sign off and communicate needs with SW.   Please call if questions.   Jhonnie Garner, OTR/L  Rehab Admissions Coordinator  [phone] 08/17/2018 11:13 AM

## 2018-08-17 NOTE — Progress Notes (Addendum)
Physical Therapy Treatment Patient Details Name: Austin Mora MRN: [id number] DOB: [birth year] Today's Date: 08/17/2018    History of Present Illness Pt admitted s/p sycnopal event with fall at home on 08/12/18.  Pt has just been discharged home from SNF [facility]) and had been home 2 1/2 hours per daughter report. Pt with history of CAD with recent STEMI on 07/28/2018 s/p DES to LAD, ischemic cardiomyopathy with EF of 30-35%, hypertension, hyperlipidemia, type 2 diabetes mellitus, GERD, and memory disorder.    PT Comments    Patient progressing with ambulation and balance and able to negotiate steps with assistance.  Currently close S with RW and minguard to ambulation without walker.  Still appropriate for SNF level rehab to progress balance, safety and to reduce fall risk.  PT to follow.    Orthostatic VS for the past 24 hrs (Last 3 readings):  BP- Lying Pulse- Lying BP- Sitting Pulse- Sitting BP- Standing at 0 minutes Pulse- Standing at 0 minutes BP- Standing at 3 minutes Pulse- Standing at 3 minutes  08/17/18 1149 136/69 61 108/53 62 110/67 72 111/71 70    Follow Up Recommendations  SNF;Supervision/Assistance - 24 hour     Equipment Recommendations  None recommended by PT    Recommendations for Other Services       Precautions / Restrictions Precautions Precautions: Fall Precaution Comments: watch BP Restrictions Weight Bearing Restrictions: No Other Position/Activity Restrictions: thigh hi TEDs    Mobility  Bed Mobility   Bed Mobility: Supine to Sit     Supine to sit: Modified independent (Device/Increase time)        Transfers   Equipment used: Rolling walker (2 wheeled);None   Sit to Stand: Supervision Stand pivot transfers: Supervision       General transfer comment: no assist for sit to stand, S for safety stand pivot to recliner  Ambulation/Gait Ambulation/Gait assistance: Supervision Gait Distance (Feet): 200 Feet Assistive device: Rolling walker  (2 wheeled);None Gait Pattern/deviations: Step-through pattern;Decreased stride length     General Gait Details: used RW with S and no LOB,  with no device close S to minguard for safety   Stairs Stairs: Yes Stairs assistance: Min guard Stair Management: One rail Left;Two rails;Alternating pattern;Step to pattern;Forwards Number of Stairs: 10 General stair comments: slower with descent and step to pattern, step through to ascend   Wheelchair Mobility    Modified Rankin (Stroke Patients Only)       Balance Overall balance assessment: Needs assistance   Sitting balance-Leahy Scale: Good     Standing balance support: No upper extremity supported Standing balance-Leahy Scale: Good                              Cognition Arousal/Alertness: Awake/alert Behavior During Therapy: WFL for tasks assessed/performed Overall Cognitive Status: History of cognitive impairments - at baseline                       Memory: Decreased short-term memory                Exercises      General Comments General comments (skin integrity, edema, etc.): no c/o dizziness during session      Pertinent Vitals/Pain Pain Assessment: No/denies pain    Home Living                      Prior Function  PT Goals (current goals can now be found in the care plan section) Progress towards PT goals: Progressing toward goals    Frequency    Min 2X/week      PT Plan Current plan remains appropriate    Co-evaluation              AM-PAC PT "6 Clicks" Mobility   Outcome Measure  Help needed turning from your back to your side while in a flat bed without using bedrails?: None Help needed moving from lying on your back to sitting on the side of a flat bed without using bedrails?: None Help needed moving to and from a bed to a chair (including a wheelchair)?: A Little Help needed standing up from a chair using your arms (e.g., wheelchair or  bedside chair)?: A Little Help needed to walk in hospital room?: A Little Help needed climbing 3-5 steps with a railing? : A Little 6 Click Score: 20    End of Session Equipment Utilized During Treatment: Gait belt Activity Tolerance: Patient tolerated treatment well Patient left: in chair;with call bell/phone within reach;with chair alarm set   PT Visit Diagnosis: Muscle weakness (generalized) (M62.81);Other abnormalities of gait and mobility (R26.89)     Time: 1478-2956 PT Time Calculation (min) (ACUTE ONLY): 28 min  Charges:  $Gait Training: 23-37 mins                     Magda Kiel, JAARS [phone] 08/17/2018    Reginia Naas 08/17/2018, 1:33 PM

## 2018-08-17 NOTE — Care Management Important Message (Signed)
Important Message  Patient Details  Name: Austin Mora MRN: [id number] Date of Birth: [birth year]   Medicare Important Message Given:  Yes     [name] 08/17/2018, 4:22 PM

## 2018-08-17 NOTE — Telephone Encounter (Signed)
LVM, reminding pt of his appt with Patric Dykes on 08-18-18.

## 2018-08-17 NOTE — Progress Notes (Signed)
PROGRESS NOTE    Austin Mora  YBO:[id number] DOB: 28-May-1945 DOA: 08/12/2018 PCP: Austin Battles, MD    Brief Narrative: HPI per Dr. Donnella Mora is a 73 y.o. male with history of recent ST elevation MI status post stent placement, ischemic cardiomyopathy was brought to the ER after patient had a fall at the living facility.  Per report patient had some argument with family and was turning back and suddenly felt.  It is not clear if patient lost consciousness but per family patient has poor memory and history is not very reliable.  EMS was called and patient blood pressure initially was found to be 80 systolic.  Patient denies any dizziness prior to the fall any chest pain shortness of breath nausea vomiting or diarrhea.  ED Course: In the ER patient blood pressure was more than 277 systolic with the labs showing increased creatinine of 1.37 from baseline of 1.02 last week.  EKG was showing no new changes with old T wave inversion in anterolateral leads.  Patient did not have any chest pain CT it is unremarkable COVID-19 test was negative.  Later when checking orthostatic changes patient was clearly orthostatic blood pressure dropping to 60s on standing.  Patient admitted for possible syncope fall and orthostatic changes.  Assessment & Plan:   Principal Problem:   Syncope Active Problems:   Memory disorder   Parkinson's disease (Austin Mora)   Ischemic cardiomyopathy   ARF (acute renal failure) (HCC)   Hypotension   Orthostatic hypotension  1 orthostatic hypotension/fall/probable syncope Patient noted to be significantly orthostatic on admission.  Patient currently on telemetry.  Patient noted to be still orthostatic on 08/14/2018 with orthostasis despite fluid boluses.  Patient with recent non-ST elevated MI with a cardiomyopathy with a EF of 30 to 35% and as such with patient's possible syncope with orthostasis/hypotension concern for possible cardiac etiology.  Patient with no  signs or symptoms of infection.  Patient afebrile.  Patient with a normal white count.  Random cortisol of 15.9.  Patient with no overt bleeding hemoglobin currently stable at 16.2.  Cardiology has been consulted and were following.  Coreg discontinued per cardiology.  ARB and spironolactone also held during this hospitalization likely will not be resumed on discharge until follow-up with cardiology.  TED hose were ordered.  Patient currently on midodrine 5 mg 3 times daily with improvement with orthostasis.  Patient seen by PT noted to have some orthostatic hypotension on measurement however remained asymptomatic.  PT.  Continue current dose of midodrine.  Outpatient follow-up.   2.  Acute kidney injury Questionable etiology.  Likely secondary to a prerenal azotemia as patient noted to have orthostatic hypotension in the setting of ARB, spironolactone.  Patient's ARB was held on admission.  Patient was still orthostatic and as such spironolactone and Coreg were discontinued.  Patient hydrated with IV fluids.  Renal function improved and creatinine was down to 1.03.  Follow.     3.  Recent STEMI(07/31/2018)/ischemic cardiomyopathy EF 30 to 35% Patient with recent STEMI status post stent placement on aspirin and Brilinta and Coreg and Lipitor.  Patient also noted to be on spironolactone and ARB.  Spironolactone ARB discontinued secondary to acute kidney injury as well as orthostatic hypotension.  Continue aspirin and Brilinta and Lipitor.  Coreg discontinued per cardiology secondary to ongoing orthostasis.  Will need to follow-up with cardiology in the outpatient setting post discharge for further management of his cardiac medications as these have been discontinued during  his hospitalization due to orthostasis.   4.  Memory issues Continue Abilify and Lexapro.  Outpatient follow-up.  5.  Hyperlipidemia Stable.  Continue statin.  6.  Hypertension ARB, spironolactone and Coreg have been discontinued due  to problem #1.  Will need outpatient follow-up with cardiology.  Follow.    DVT prophylaxis: Lovenox Code Status: Full Family Communication: Updated patient. Disposition Plan: SNF when bed available..    Consultants:   Cardiology: Dr. Marlou Mora 08/13/2018    Procedures:   CT head without contrast 08/12/2018  Antimicrobials:  None   Subjective: Patient sitting up in bed about to work with physical therapy.  Denies any chest pain or shortness of breath.  Denies any dizzy spells.   Objective: Vitals:   08/16/18 2108 08/17/18 0413 08/17/18 0500 08/17/18 0808  BP: (!) 143/76 (!) 123/54  118/70  Pulse: (!) 59 (!) 54  (!) 58  Resp: 16 20  16   Temp: 97.6 F (36.4 C) 98.3 F (36.8 C)  (!) 97.5 F (36.4 C)  TempSrc: Oral   Oral  SpO2: 97% 99%  99%  Weight: 75.3 kg  75.3 kg   Height:        Intake/Output Summary (Last 24 hours) at 08/17/2018 1205 Last data filed at 08/17/2018 1761 Gross per 24 hour  Intake 360 ml  Output 0 ml  Net 360 ml   Filed Weights   08/15/18 2046 08/16/18 2108 08/17/18 0500  Weight: 75.4 kg 75.3 kg 75.3 kg    Examination:  General exam: NAD Respiratory system: CTAB, no wheezes, no crackles, no rhonchi.  Normal respiratory effort.   Cardiovascular system: RRR no murmurs rubs or gallops.  No JVD.  No lower extremity edema.  Gastrointestinal system: Abdomen is nontender, nondistended, soft, positive bowel sounds.  No rebound.  No guarding.  Central nervous system: Alert and oriented. No focal neurological deficits. Extremities: Symmetric 5 x 5 power. Skin: No rashes, lesions or ulcers Psychiatry: Judgement and insight appear poor to fair. Mood & affect appropriate.     Data Reviewed: I have personally reviewed following labs and imaging studies  CBC: Recent Labs  Lab 08/12/18 1713 08/13/18 0351 08/14/18 0430 08/15/18 0434 08/16/18 0328  WBC 7.7 8.2 6.5 5.9 6.1  NEUTROABS 5.5  --   --   --   --   HGB 16.2 15.4 14.9 15.6 16.2  HCT 46.9  45.7 43.4 45.7 48.4  MCV 85.9 87.2 86.5 85.9 86.9  PLT 156 133* 120* 120* 607*   Basic Metabolic Panel: Recent Labs  Lab 08/12/18 1713 08/13/18 0351 08/14/18 0430 08/15/18 0434 08/16/18 0328  NA 136 136 138 137 138  K 3.6 3.6 3.5 3.4* 3.6  CL 105 104 109 110 110  CO2 20* 25 22 21* 20*  GLUCOSE 132* 82 88 88 88  BUN 14 12 8  6* 6*  CREATININE 1.37* 1.37* 1.09 0.95 1.03  CALCIUM 9.6 9.3 8.9 9.1 9.6   GFR: Estimated Creatinine Clearance: 62.9 mL/min (by C-G formula based on SCr of 1.03 mg/dL). Liver Function Tests: Recent Labs  Lab 08/12/18 1713  AST 27  ALT 31  ALKPHOS 65  BILITOT 1.0  PROT 6.5  ALBUMIN 4.0   No results for input(s): LIPASE, AMYLASE in the last 168 hours. No results for input(s): AMMONIA in the last 168 hours. Coagulation Profile: No results for input(s): INR, PROTIME in the last 168 hours. Cardiac Enzymes: No results for input(s): CKTOTAL, CKMB, CKMBINDEX, TROPONINI in the last 168 hours. BNP (last  3 results) No results for input(s): PROBNP in the last 8760 hours. HbA1C: No results for input(s): HGBA1C in the last 72 hours. CBG: Recent Labs  Lab 08/12/18 2219 08/13/18 0658  GLUCAP 91 84   Lipid Profile: No results for input(s): CHOL, HDL, LDLCALC, TRIG, CHOLHDL, LDLDIRECT in the last 72 hours. Thyroid Function Tests: No results for input(s): TSH, T4TOTAL, FREET4, T3FREE, THYROIDAB in the last 72 hours. Anemia Panel: No results for input(s): VITAMINB12, FOLATE, FERRITIN, TIBC, IRON, RETICCTPCT in the last 72 hours. Sepsis Labs: No results for input(s): PROCALCITON, LATICACIDVEN in the last 168 hours.  Recent Results (from the past 240 hour(s))  SARS Coronavirus 2 (CEPHEID - Performed in [facility] hospital lab), Hosp Order     Status: None   Collection Time: 08/12/18  7:21 PM   Specimen: Nasopharyngeal Swab  Result Value Ref Range Status   SARS Coronavirus 2 NEGATIVE NEGATIVE Final    Comment: (NOTE) If result is NEGATIVE SARS-CoV-2  target nucleic acids are NOT DETECTED. The SARS-CoV-2 RNA is generally detectable in upper and lower  respiratory specimens during the acute phase of infection. The lowest  concentration of SARS-CoV-2 viral copies this assay can detect is 250  copies / mL. A negative result does not preclude SARS-CoV-2 infection  and should not be used as the sole basis for treatment or other  patient management decisions.  A negative result may occur with  improper specimen collection / handling, submission of specimen other  than nasopharyngeal swab, presence of viral mutation(s) within the  areas targeted by this assay, and inadequate number of viral copies  (<250 copies / mL). A negative result must be combined with clinical  observations, patient history, and epidemiological information. If result is POSITIVE SARS-CoV-2 target nucleic acids are DETECTED. The SARS-CoV-2 RNA is generally detectable in upper and lower  respiratory specimens dur ing the acute phase of infection.  Positive  results are indicative of active infection with SARS-CoV-2.  Clinical  correlation with patient history and other diagnostic information is  necessary to determine patient infection status.  Positive results do  not rule out bacterial infection or co-infection with other viruses. If result is PRESUMPTIVE POSTIVE SARS-CoV-2 nucleic acids MAY BE PRESENT.   A presumptive positive result was obtained on the submitted specimen  and confirmed on repeat testing.  While 2019 novel coronavirus  (SARS-CoV-2) nucleic acids may be present in the submitted sample  additional confirmatory testing may be necessary for epidemiological  and / or clinical management purposes  to differentiate between  SARS-CoV-2 and other Sarbecovirus currently known to infect humans.  If clinically indicated additional testing with an alternate test  methodology [phone]) is advised. The SARS-CoV-2 RNA is generally  detectable in upper and lower  respiratory sp ecimens during the acute  phase of infection. The expected result is Negative. Fact Sheet for Patients:  StrictlyIdeas.no Fact Sheet for Healthcare Providers: BankingDealers.co.za This test is not yet approved or cleared by the [place] FDA and has been authorized for detection and/or diagnosis of SARS-CoV-2 by FDA under an Emergency Use Authorization (EUA).  This EUA will remain in effect (meaning this test can be used) for the duration of the COVID-19 declaration under Section 564(b)(1) of the Act, 21 U.S.C. section 360bbb-3(b)(1), unless the authorization is terminated or revoked sooner. Performed at [facility], [place] [address], [place], [place] [address]          Radiology Studies: No results found.  Scheduled Meds:  ARIPiprazole  5 mg Oral Daily   aspirin  81 mg Oral Daily   enoxaparin (LOVENOX) injection  40 mg Subcutaneous Q24H   escitalopram  10 mg Oral Daily   midodrine  5 mg Oral TID WC   pantoprazole  40 mg Oral Daily   rosuvastatin  40 mg Oral QPM   ticagrelor  90 mg Oral BID   Continuous Infusions:   LOS: 4 days    Time spent: 35 minutes    Irine Seal, MD [facility]  If 7PM-7AM, please contact night-coverage [email or web address] 08/17/2018, 12:05 PM

## 2018-08-17 NOTE — Progress Notes (Signed)
Called and updated daughter, Amy regarding Pt's plan of care. All questions answered.  Paulla Fore, RN

## 2018-08-18 ENCOUNTER — Ambulatory Visit: Payer: Medicare PPO | Admitting: Cardiology

## 2018-08-18 LAB — BASIC METABOLIC PANEL
Anion gap: 11 (ref 5–15)
BUN: 10 mg/dL (ref 8–23)
CO2: 20 mmol/L — ABNORMAL LOW (ref 22–32)
Calcium: 9.7 mg/dL (ref 8.9–10.3)
Chloride: 105 mmol/L (ref 98–111)
Creatinine, Ser: 1.16 mg/dL (ref 0.61–1.24)
GFR calc Af Amer: >60 mL/min (ref >60)
GFR calc non Af Amer: >60 mL/min (ref >60)
Glucose, Bld: 75 mg/dL (ref 70–99)
Potassium: 3.5 mmol/L (ref 3.5–5.1)
Sodium: 136 mmol/L (ref 135–145)

## 2018-08-18 LAB — SARS CORONAVIRUS 2 BY RT PCR (HOSPITAL ORDER, PERFORMED IN ~~LOC~~ HOSPITAL LAB): SARS Coronavirus 2: NEGATIVE

## 2018-08-18 MED ORDER — POTASSIUM CHLORIDE 20 MEQ PO PACK
40.0000 meq | PACK | Freq: Once | ORAL | Status: AC
  Administered 2018-08-18: 40 meq via ORAL
  Filled 2018-08-18: qty 2

## 2018-08-18 NOTE — Progress Notes (Signed)
PROGRESS NOTE    Austin Mora  IRW:[id number] DOB: May 26, 1945 DOA: 08/12/2018 PCP: Leanna Battles, MD    Brief Narrative: HPI per Dr. Donnella Bi is a 73 y.o. male with history of recent ST elevation MI status post stent placement, ischemic cardiomyopathy was brought to the ER after patient had a fall at the living facility.  Per report patient had some argument with family and was turning back and suddenly felt.  It is not clear if patient lost consciousness but per family patient has poor memory and history is not very reliable.  EMS was called and patient blood pressure initially was found to be 80 systolic.  Patient denies any dizziness prior to the fall any chest pain shortness of breath nausea vomiting or diarrhea.  ED Course: In the ER patient blood pressure was more than 761 systolic with the labs showing increased creatinine of 1.37 from baseline of 1.02 last week.  EKG was showing no new changes with old T wave inversion in anterolateral leads.  Patient did not have any chest pain CT it is unremarkable COVID-19 test was negative.  Later when checking orthostatic changes patient was clearly orthostatic blood pressure dropping to 60s on standing.  Patient admitted for possible syncope fall and orthostatic changes.  Assessment & Plan:   Principal Problem:   Syncope Active Problems:   Memory disorder   Parkinson's disease ([place])   Ischemic cardiomyopathy   ARF (acute renal failure) (HCC)   Hypotension   Orthostatic hypotension  1 orthostatic hypotension/fall/probable syncope Patient noted to be significantly orthostatic on admission.  Patient currently on telemetry.  Patient noted to be still orthostatic on 08/14/2018 with orthostasis despite fluid boluses.  Patient with recent non-ST elevated MI with a cardiomyopathy with a EF of 30 to 35% and as such with patient's possible syncope with orthostasis/hypotension concern for possible cardiac etiology.  Patient with no  signs or symptoms of infection.  Patient afebrile.  Patient with a normal white count.  Random cortisol of 15.9.  Patient with no overt bleeding hemoglobin currently stable at 16.2.  Cardiology has been consulted and were following.  Coreg discontinued per cardiology.  ARB and spironolactone also held during this hospitalization likely will not be resumed on discharge until follow-up with cardiology.  TED hose were ordered.  Patient currently on midodrine 5 mg 3 times daily with improvement with orthostasis.  Patient seen by PT noted to have some orthostatic hypotension on measurement however remained asymptomatic.  PT.  Continue current dose of midodrine.  Outpatient follow-up.   2.  Acute kidney injury Questionable etiology.  Likely secondary to a prerenal azotemia as patient noted to have orthostatic hypotension in the setting of ARB, spironolactone.  Patient's ARB was held on admission.  Patient was still orthostatic and as such spironolactone and Coreg were discontinued.  Patient hydrated with IV fluids.  Renal function improved and creatinine was down to 1.16.  Follow.     3.  Recent STEMI(07/31/2018)/ischemic cardiomyopathy EF 30 to 35% Patient with recent STEMI status post stent placement on aspirin and Brilinta and Coreg and Lipitor.  Patient also noted to be on spironolactone and ARB.  Spironolactone ARB discontinued secondary to acute kidney injury as well as orthostatic hypotension.  Coreg discontinued per cardiology secondary to ongoing orthostasis.  Continue aspirin, Brilinta, Lipitor.  Will need to follow-up with cardiology in the outpatient setting post discharge for further management of his cardiac medications as these have been discontinued during his hospitalization  due to orthostasis.   4.  Memory issues Continue Abilify and Lexapro.  Outpatient follow-up.  5.  Hyperlipidemia Continue statin.   6.  Hypertension ARB, spironolactone and Coreg have been discontinued due to problem #1.   Will need outpatient follow-up with cardiology.  Follow.    DVT prophylaxis: Lovenox Code Status: Full Family Communication: Updated patient. Disposition Plan: Home with home health hopefully tomorrow.   Consultants:   Cardiology: Dr. Marlou Porch 08/13/2018    Procedures:   CT head without contrast 08/12/2018  Antimicrobials:  None   Subjective: Patient sitting up in bed.  Denies any chest pain.  No shortness of breath.  Denies any further dizzy spells.  No falls noted.   Objective: Vitals:   08/17/18 1737 08/17/18 2148 08/18/18 0547 08/18/18 0839  BP: 139/80 138/78 130/60 119/69  Pulse: 68 70 69 70  Resp:  19 18 16   Temp: (!) 97.5 F (36.4 C) 97.7 F (36.5 C) 97.7 F (36.5 C) 97.7 F (36.5 C)  TempSrc: Oral Oral Oral Oral  SpO2: 97% 98% 98% 97%  Weight:  75.4 kg    Height:        Intake/Output Summary (Last 24 hours) at 08/18/2018 1215 Last data filed at 08/18/2018 0905 Gross per 24 hour  Intake 0 ml  Output 400 ml  Net -400 ml   Filed Weights   08/16/18 2108 08/17/18 0500 08/17/18 2148  Weight: 75.3 kg 75.3 kg 75.4 kg    Examination:  General exam: NAD Respiratory system: Lungs clear to auscultation bilaterally.  No wheezes, no crackles, no rhonchi.  Normal respiratory effort. Cardiovascular system: Regular rate rhythm no murmurs rubs or gallops.  No JVD.  No lower extremity edema.  Gastrointestinal system: Abdomen is soft, nontender, nondistended, positive bowel sounds.  No rebound.  No guarding.  Central nervous system: Alert and oriented. No focal neurological deficits. Extremities: Symmetric 5 x 5 power. Skin: No rashes, lesions or ulcers Psychiatry: Judgement and insight appear poor to fair. Mood & affect appropriate.     Data Reviewed: I have personally reviewed following labs and imaging studies  CBC: Recent Labs  Lab 08/12/18 1713 08/13/18 0351 08/14/18 0430 08/15/18 0434 08/16/18 0328  WBC 7.7 8.2 6.5 5.9 6.1  NEUTROABS 5.5  --   --    --   --   HGB 16.2 15.4 14.9 15.6 16.2  HCT 46.9 45.7 43.4 45.7 48.4  MCV 85.9 87.2 86.5 85.9 86.9  PLT 156 133* 120* 120* 938*   Basic Metabolic Panel: Recent Labs  Lab 08/13/18 0351 08/14/18 0430 08/15/18 0434 08/16/18 0328 08/18/18 0404  NA 136 138 137 138 136  K 3.6 3.5 3.4* 3.6 3.5  CL 104 109 110 110 105  CO2 25 22 21* 20* 20*  GLUCOSE 82 88 88 88 75  BUN 12 8 6* 6* 10  CREATININE 1.37* 1.09 0.95 1.03 1.16  CALCIUM 9.3 8.9 9.1 9.6 9.7   GFR: Estimated Creatinine Clearance: 55.9 mL/min (by C-G formula based on SCr of 1.16 mg/dL). Liver Function Tests: Recent Labs  Lab 08/12/18 1713  AST 27  ALT 31  ALKPHOS 65  BILITOT 1.0  PROT 6.5  ALBUMIN 4.0   No results for input(s): LIPASE, AMYLASE in the last 168 hours. No results for input(s): AMMONIA in the last 168 hours. Coagulation Profile: No results for input(s): INR, PROTIME in the last 168 hours. Cardiac Enzymes: No results for input(s): CKTOTAL, CKMB, CKMBINDEX, TROPONINI in the last 168 hours. BNP (last  3 results) No results for input(s): PROBNP in the last 8760 hours. HbA1C: No results for input(s): HGBA1C in the last 72 hours. CBG: Recent Labs  Lab 08/12/18 2219 08/13/18 0658  GLUCAP 91 84   Lipid Profile: No results for input(s): CHOL, HDL, LDLCALC, TRIG, CHOLHDL, LDLDIRECT in the last 72 hours. Thyroid Function Tests: No results for input(s): TSH, T4TOTAL, FREET4, T3FREE, THYROIDAB in the last 72 hours. Anemia Panel: No results for input(s): VITAMINB12, FOLATE, FERRITIN, TIBC, IRON, RETICCTPCT in the last 72 hours. Sepsis Labs: No results for input(s): PROCALCITON, LATICACIDVEN in the last 168 hours.  Recent Results (from the past 240 hour(s))  SARS Coronavirus 2 (CEPHEID - Performed in [facility] hospital lab), Hosp Order     Status: None   Collection Time: 08/12/18  7:21 PM   Specimen: Nasopharyngeal Swab  Result Value Ref Range Status   SARS Coronavirus 2 NEGATIVE NEGATIVE Final     Comment: (NOTE) If result is NEGATIVE SARS-CoV-2 target nucleic acids are NOT DETECTED. The SARS-CoV-2 RNA is generally detectable in upper and lower  respiratory specimens during the acute phase of infection. The lowest  concentration of SARS-CoV-2 viral copies this assay can detect is 250  copies / mL. A negative result does not preclude SARS-CoV-2 infection  and should not be used as the sole basis for treatment or other  patient management decisions.  A negative result may occur with  improper specimen collection / handling, submission of specimen other  than nasopharyngeal swab, presence of viral mutation(s) within the  areas targeted by this assay, and inadequate number of viral copies  (<250 copies / mL). A negative result must be combined with clinical  observations, patient history, and epidemiological information. If result is POSITIVE SARS-CoV-2 target nucleic acids are DETECTED. The SARS-CoV-2 RNA is generally detectable in upper and lower  respiratory specimens dur ing the acute phase of infection.  Positive  results are indicative of active infection with SARS-CoV-2.  Clinical  correlation with patient history and other diagnostic information is  necessary to determine patient infection status.  Positive results do  not rule out bacterial infection or co-infection with other viruses. If result is PRESUMPTIVE POSTIVE SARS-CoV-2 nucleic acids MAY BE PRESENT.   A presumptive positive result was obtained on the submitted specimen  and confirmed on repeat testing.  While 2019 novel coronavirus  (SARS-CoV-2) nucleic acids may be present in the submitted sample  additional confirmatory testing may be necessary for epidemiological  and / or clinical management purposes  to differentiate between  SARS-CoV-2 and other Sarbecovirus currently known to infect humans.  If clinically indicated additional testing with an alternate test  methodology [phone]) is advised. The SARS-CoV-2  RNA is generally  detectable in upper and lower respiratory sp ecimens during the acute  phase of infection. The expected result is Negative. Fact Sheet for Patients:  StrictlyIdeas.no Fact Sheet for Healthcare Providers: BankingDealers.co.za This test is not yet approved or cleared by the [place] FDA and has been authorized for detection and/or diagnosis of SARS-CoV-2 by FDA under an Emergency Use Authorization (EUA).  This EUA will remain in effect (meaning this test can be used) for the duration of the COVID-19 declaration under Section 564(b)(1) of the Act, 21 U.S.C. section 360bbb-3(b)(1), unless the authorization is terminated or revoked sooner. Performed at [facility], [place] [address] [place], [place] [address]          Radiology Studies: No results found.  Scheduled Meds:  ARIPiprazole  5 mg Oral Daily   aspirin  81 mg Oral Daily   enoxaparin (LOVENOX) injection  40 mg Subcutaneous Q24H   escitalopram  10 mg Oral Daily   midodrine  5 mg Oral TID WC   pantoprazole  40 mg Oral Daily   potassium chloride  40 mEq Oral Once   rosuvastatin  40 mg Oral QPM   ticagrelor  90 mg Oral BID   Continuous Infusions:   LOS: 5 days    Time spent: 35 minutes    Irine Seal, MD [facility]  If 7PM-7AM, please contact night-coverage [email or web address] 08/18/2018, 12:15 PM

## 2018-08-18 NOTE — Progress Notes (Signed)
Occupational Therapy Treatment Patient Details Name: Austin Mora MRN: [id number] DOB: 10-Dec-1945 Today's Date: 08/18/2018    History of present illness Pt admitted s/p sycnopal event with fall at home on 08/12/18.  Pt has just been discharged home from SNF [facility]) and had been home 2 1/2 hours per daughter report. Pt with history of CAD with recent STEMI on 07/28/2018 s/p DES to LAD, ischemic cardiomyopathy with EF of 30-35%, hypertension, hyperlipidemia, type 2 diabetes mellitus, GERD, and memory disorder.   OT comments  Pt is able to complete ADLs with min guard assist.  He requires assist due to impaired balance and impaired cognition.  He scored a 10 on the BIMs which is indicative of a moderate cognitive impairment.  Due to his increased risk of falls, and his wife's inability to assist him, recommend SNF level rehab to allow him to return home at supervision level due to cognitive impairments.   Follow Up Recommendations  SNF    Equipment Recommendations  3 in 1 bedside commode    Recommendations for Other Services      Precautions / Restrictions Precautions Precautions: Fall       Mobility Bed Mobility Overal bed mobility: Modified Independent                Transfers Overall transfer level: Needs assistance Equipment used: Rolling walker (2 wheeled);None Transfers: Sit to/from [employer] to Stand: Supervision Stand pivot transfers: Supervision       General transfer comment: supervision for safety     Balance Overall balance assessment: Needs assistance Sitting-balance support: No upper extremity supported;Feet supported Sitting balance-Leahy Scale: Good     Standing balance support: No upper extremity supported Standing balance-Leahy Scale: Fair Standing balance comment: able to maintain static standing with min guard assist                            ADL either performed or assessed with clinical judgement   ADL  Overall ADL's : Needs assistance/impaired     Grooming: Wash/dry hands;Wash/dry face;Oral care;Brushing hair;Min guard;Standing       Lower Body Bathing: Min guard;Sit to/from stand       Lower Body Dressing: Min guard;Sit to/from stand   Toilet Transfer: Min guard;Ambulation;Comfort height toilet;Grab bars;RW   Toileting- Water quality scientist and Hygiene: Min guard;Sit to/from stand       Functional mobility during ADLs: Passenger transport manager     Praxis      Cognition Arousal/Alertness: Awake/alert Behavior During Therapy: WFL for tasks assessed/performed Overall Cognitive Status: No family/caregiver present to determine baseline cognitive functioning                                 General Comments: Administered the BIMs to pt.  He scored a 10 which is indicitive of a moderate cognitive impairment.  He demonstrated difficulty with orientation to day of the week and recall.  He voices that he is frustrated today and wishes to go home.  Judgement and safety are impaired and he requires supervision and cues for safety         Exercises     Shoulder Instructions       General Comments      Pertinent Vitals/ Pain       Pain Assessment: No/denies  pain  Home Living                                          Prior Functioning/Environment              Frequency  Min 2X/week        Progress Toward Goals  OT Goals(current goals can now be found in the care plan section)  Progress towards OT goals: Progressing toward goals     Plan Discharge plan remains appropriate    Co-evaluation                 AM-PAC OT "6 Clicks" Daily Activity     Outcome Measure   Help from another person eating meals?: None Help from another person taking care of personal grooming?: A Little Help from another person toileting, which includes using toliet, bedpan, or urinal?: A Little Help from  another person bathing (including washing, rinsing, drying)?: A Little Help from another person to put on and taking off regular upper body clothing?: None Help from another person to put on and taking off regular lower body clothing?: A Little 6 Click Score: 20    End of Session Equipment Utilized During Treatment: Rolling walker  OT Visit Diagnosis: Unsteadiness on feet (R26.81);Other symptoms and signs involving cognitive function;Muscle weakness (generalized) (M62.81)   Activity Tolerance Patient tolerated treatment well   Patient Left in bed;with call bell/phone within reach;with bed alarm set   Nurse Communication          Time: 3557-3220 OT Time Calculation (min): 21 min  Charges: OT General Charges $OT Visit: 1 Visit OT Treatments $Self Care/Home Management : 8-22 mins  Lucille Passy, OTR/L Acute Rehabilitation Services Pager [phone] Office [phone]    Lucille Passy M 08/18/2018, 1:40 PM

## 2018-08-18 NOTE — TOC Progression Note (Signed)
Transition of Care (TOC) - Progression Note  **Insurance denied [facility] and denial upheld after Peer-To-Peer **Patient's wife wants to appeal - daughter will assist with the appeal **Patient will discharge home Wednesday, 7/15    Patient Details  Name: ASIA FAVATA MRN: [id number] Date of Birth: Nov 14, 1945  Transition of Care [facility]) CM/SW Contact  Sharlet Salina Mila Homer, LCSW Phone Number: 08/18/2018, 6:08 PM  Clinical Narrative:  [facility]alkin denied patient for short-term rehab and this denial was upheld after the peer-to-peer. Per Mickel Baas with [employer], patient/family can appeal and after talking with daughter Mancel Parsons and patient's wife, they decided to appeal the denial and was given the necessary information - phone #[phone] and the determination will be made within 72 hours after appeal request made.. Daughter aware that patient will still discharge tomorrow.  CSW advised by Mickel Baas with [employer] that request was denied as a lower level of care was recommended/needed for patient (by Psychologist, occupational).  Patient uses [facility] services. [employer] is delivering a walker, and per daughter, he does not need any other durable medical equipment. Per daughter her parent's use [facility] on [address].      Expected Discharge Plan: Skilled Nursing Facility(Daughter also wants her father to be evaluated by CIR) Barriers to Discharge: Ship broker, Continued Medical Work up  Expected Discharge Plan and Services Expected Discharge Plan: Skilled Nursing Facility(Daughter also wants her father to be evaluated by CIR) In-house Referral: Clinical Social Work Discharge Planning Services: NA Post Acute Care Choice: [place] Living arrangements for the past 2 months: Apartment                 DME Arranged: N/A - No DME needed per daughter. He is getting a walker delivered DME Agency: [place] Arranged: NA - HH will be arranged  with [place] on 7/14 [facility] Agency: NA         Social Determinants of Health ([place]) Interventions  CSW will assist as needed with arranging [place] and assuring patient's wife can get her husband's medications.  Readmission Risk Interventions No flowsheet data found.

## 2018-08-19 DIAGNOSIS — N179 Acute kidney failure, unspecified: Secondary | ICD-10-CM

## 2018-08-19 DIAGNOSIS — I959 Hypotension, unspecified: Secondary | ICD-10-CM

## 2018-08-19 LAB — BASIC METABOLIC PANEL
Anion gap: 12 (ref 5–15)
BUN: 13 mg/dL (ref 8–23)
CO2: 20 mmol/L — ABNORMAL LOW (ref 22–32)
Calcium: 9.8 mg/dL (ref 8.9–10.3)
Chloride: 103 mmol/L (ref 98–111)
Creatinine, Ser: 1.23 mg/dL (ref 0.61–1.24)
GFR calc Af Amer: >60 mL/min (ref >60)
GFR calc non Af Amer: 57 mL/min — ABNORMAL LOW (ref >60)
Glucose, Bld: 111 mg/dL — ABNORMAL HIGH (ref 70–99)
Potassium: 3.6 mmol/L (ref 3.5–5.1)
Sodium: 135 mmol/L (ref 135–145)

## 2018-08-19 MED ORDER — MIDODRINE HCL 5 MG PO TABS: 5.0000 mg | ORAL_TABLET | Freq: Three times a day (TID) | ORAL | 0 refills | Status: AC

## 2018-08-19 MED FILL — MIDODRINE HCL 5 MG TABLET: 5 | 30 days supply | Qty: 90 | Fill #0

## 2018-08-19 NOTE — Care Management Important Message (Signed)
Important Message  Patient Details  Name: Austin Mora MRN: [id number] Date of Birth: July 15, 1945   Medicare Important Message Given:  Yes     [name] 08/19/2018, 2:02 PM

## 2018-08-19 NOTE — Discharge Summary (Signed)
Physician Discharge Summary  Austin Mora:[id number] DOB: 09/06/1945 DOA: 08/12/2018  PCP: Leanna Battles, MD  Admit date: 08/12/2018 Discharge date: 08/19/2018  Admitted From: Home Disposition: Home  Recommendations for Outpatient Follow-up:  1. Follow up with PCP in 1 week with repeat CBC/BMP 2. Outpatient follow-up with cardiology 3. Follow up in ED if symptoms worsen or new appear   Home Health: Home health PT/OT/RN Equipment/Devices: None  Discharge Condition: Stable CODE STATUS: Full Diet recommendation: Heart healthy  Brief/Interim Summary: 73 year old male with history of recent ST elevation MI status post stent placement, ischemic cardiomyopathy presented on 08/12/2018 with a fall.  He was initially found to have systolic blood pressure in the 80s.  He had mildly elevated creatinine; EKG showed no new changes.  COVID-19 testing was negative.  He was found to be orthostatic.  He was admitted for syncope/fall and orthostatic hypotension.  During the hospitalization, his antihypertensives were held.  Cardiology recommended outpatient follow-up.  Midodrine was started.  PT recommended SNF placement.  Patient and family refused SNF placement.  He is currently medically stable for discharge.  He will be discharged home with home health today.  Discharge Diagnoses:   Probable syncope Orthostatic hypotension Fall -Patient noted to be significantly orthostatic on admission.  CT of the head was negative for any acute abnormality. -Treated with IV fluid boluses. -Has also been started on midodrine which will be continued. -Antihypertensives including Coreg, losartan and spironolactone have been held. -Symptoms have much improved.  Blood pressure improved. -PT recommended SNF.  Patient and family refused SNF. -We will discharge home today with home health  Acute kidney injury -Likely secondary to prerenal azotemia. -Renal function improved.  Losartan and spironolactone still on  hold.  Outpatient follow-up  Recent ST elevation MI (07/31/2018) Ischemic cardiomyopathy with EF of 30 to 35% History of hypertension Hyperlipidemia -Stable.  Cardiology evaluation appreciated.  Cardiology has signed off.  Outpatient follow-up with cardiology.  Continue aspirin, Brilinta and Lipitor.  Antihypertensives plan as above  Memory issues -Continue Abilify and Lexapro.  Outpatient follow-up  Discharge Instructions  Discharge Instructions    Ambulatory referral to Cardiology   Complete by: As directed    followup   Diet - low sodium heart healthy   Complete by: As directed    Increase activity slowly   Complete by: As directed      Allergies as of 08/19/2018      Reactions   Aricept [donepezil Hcl]    Vivid dreams   Sulfa Antibiotics Hives      Medication List    STOP taking these medications   carvedilol 3.125 MG tablet Commonly known as: COREG   losartan 25 MG tablet Commonly known as: COZAAR   spironolactone 25 MG tablet Commonly known as: ALDACTONE     TAKE these medications   ALPRAZolam 0.5 MG tablet Commonly known as: XANAX Take 0.5 mg by mouth daily as needed for anxiety (or agitation).   ARIPiprazole 5 MG tablet Commonly known as: ABILIFY Take 5 mg by mouth daily.   aspirin 81 MG tablet Take 81 mg by mouth daily.   escitalopram 10 MG tablet Commonly known as: LEXAPRO Take 10 mg by mouth daily.   midodrine 5 MG tablet Commonly known as: PROAMATINE Take 1 tablet (5 mg total) by mouth 3 (three) times daily with meals.   NICODERM CQ TD Place 1 patch onto the skin daily as needed.   nitroGLYCERIN 0.4 MG SL tablet Commonly known as: Scientist, clinical (histocompatibility and immunogenetics)  1 tablet (0.4 mg total) under the tongue every 5 (five) minutes as needed.   omeprazole 40 MG capsule Commonly known as: PRILOSEC Take 40 mg by mouth daily before breakfast.   rosuvastatin 40 MG tablet Commonly known as: CRESTOR Take 1 tablet (40 mg total) by mouth daily at 6 PM. What  changed: when to take this   ticagrelor 90 MG Tabs tablet Commonly known as: BRILINTA Take 1 tablet (90 mg total) by mouth 2 (two) times daily.            Durable Medical Equipment  (From admission, onward)         Start     Ordered   08/14/18 1552  For home use only DME Walker rolling  Once    Question:  Patient needs a walker to treat with the following condition  Answer:  Debility   08/14/18 1552   08/14/18 1552  For home use only DME 3 n 1  Once     08/14/18 1552         Follow-up Information    Leanna Battles, MD. Schedule an appointment as soon as possible for a visit in 1 week(s).   Specialty: Internal Medicine Why: with repeat cbc/bmp Contact information: [place] [address] [phone]        [place] Sine, MD .   Specialty: Cardiology Contact information: [address] [place] [address] [phone]          Allergies  Allergen Reactions  . Aricept [Donepezil Hcl]     Vivid dreams  . Sulfa Antibiotics Hives    Consultations:  Cardiology   Procedures/Studies: Ct Head Wo Contrast  Result Date: 08/12/2018 CLINICAL DATA:  Head trauma, headache. Fall, question syncope. EXAM: CT HEAD WITHOUT CONTRAST TECHNIQUE: Contiguous axial images were obtained from the base of the skull through the vertex without intravenous contrast. COMPARISON:  Right MRI 01/19/2018 FINDINGS: Brain: No intracranial hemorrhage, mass effect, or midline shift. No hydrocephalus. Brain volume is normal for age. Minor chronic small vessel ischemia. Remote lacunar infarct versus prominent perivascular space in the right insula. The basilar cisterns are patent. No evidence of territorial infarct or acute ischemia. No extra-axial or intracranial fluid collection. Vascular: No hyperdense vessel. Skull base atherosclerosis. Skull: No fracture or focal lesion. Sinuses/Orbits: Paranasal sinuses and mastoid air cells are clear. The visualized orbits  are unremarkable. Other: None. IMPRESSION: No acute intracranial abnormality. Electronically Signed   By: Keith Rake M.D.   On: 08/12/2018 19:24       Subjective: Patient seen and examined at bedside.  He is a poor historian.  Denies any overnight fever or vomiting.  Feels okay to go home.  Discharge Exam: Vitals:   08/19/18 0515 08/19/18 0953  BP: 126/78 111/66  Pulse: 67 69  Resp: 18 18  Temp: 97.8 F (36.6 C) 97.7 F (36.5 C)  SpO2: 98% 97%    General: No acute distress.  Poor historian.  Awake Cardiovascular: rate controlled, S1/S2 + Respiratory: bilateral decreased breath sounds at bases Abdominal: Soft, NT, ND, bowel sounds + Extremities: no edema, no cyanosis    The results of significant diagnostics from this hospitalization (including imaging, microbiology, ancillary and laboratory) are listed below for reference.     Microbiology: Recent Results (from the past 240 hour(s))  SARS Coronavirus 2 (CEPHEID - Performed in [place] hospital lab), Hosp Order     Status: None   Collection Time: 08/12/18  7:21 PM  Specimen: Nasopharyngeal Swab  Result Value Ref Range Status   SARS Coronavirus 2 NEGATIVE NEGATIVE Final    Comment: (NOTE) If result is NEGATIVE SARS-CoV-2 target nucleic acids are NOT DETECTED. The SARS-CoV-2 RNA is generally detectable in upper and lower  respiratory specimens during the acute phase of infection. The lowest  concentration of SARS-CoV-2 viral copies this assay can detect is 250  copies / mL. A negative result does not preclude SARS-CoV-2 infection  and should not be used as the sole basis for treatment or other  patient management decisions.  A negative result may occur with  improper specimen collection / handling, submission of specimen other  than nasopharyngeal swab, presence of viral mutation(s) within the  areas targeted by this assay, and inadequate number of viral copies  (<250 copies / mL). A negative result must be  combined with clinical  observations, patient history, and epidemiological information. If result is POSITIVE SARS-CoV-2 target nucleic acids are DETECTED. The SARS-CoV-2 RNA is generally detectable in upper and lower  respiratory specimens dur ing the acute phase of infection.  Positive  results are indicative of active infection with SARS-CoV-2.  Clinical  correlation with patient history and other diagnostic information is  necessary to determine patient infection status.  Positive results do  not rule out bacterial infection or co-infection with other viruses. If result is PRESUMPTIVE POSTIVE SARS-CoV-2 nucleic acids MAY BE PRESENT.   A presumptive positive result was obtained on the submitted specimen  and confirmed on repeat testing.  While 2019 novel coronavirus  (SARS-CoV-2) nucleic acids may be present in the submitted sample  additional confirmatory testing may be necessary for epidemiological  and / or clinical management purposes  to differentiate between  SARS-CoV-2 and other Sarbecovirus currently known to infect humans.  If clinically indicated additional testing with an alternate test  methodology [phone]) is advised. The SARS-CoV-2 RNA is generally  detectable in upper and lower respiratory sp ecimens during the acute  phase of infection. The expected result is Negative. Fact Sheet for Patients:  StrictlyIdeas.no Fact Sheet for Healthcare Providers: BankingDealers.co.za This test is not yet approved or cleared by the [place] FDA and has been authorized for detection and/or diagnosis of SARS-CoV-2 by FDA under an Emergency Use Authorization (EUA).  This EUA will remain in effect (meaning this test can be used) for the duration of the COVID-19 declaration under Section 564(b)(1) of the Act, 21 U.S.C. section 360bbb-3(b)(1), unless the authorization is terminated or revoked sooner. Performed at [facility], [place] [address] [place], [place] [address]   SARS Coronavirus 2 [facility] order, Performed in [facility] hospital lab)     Status: None   Collection Time: 08/18/18  3:34 PM  Result Value Ref Range Status   SARS Coronavirus 2 NEGATIVE NEGATIVE Final    Comment: (NOTE) If result is NEGATIVE SARS-CoV-2 target nucleic acids are NOT DETECTED. The SARS-CoV-2 RNA is generally detectable in upper and lower  respiratory specimens during the acute phase of infection. The lowest  concentration of SARS-CoV-2 viral copies this assay can detect is 250  copies / mL. A negative result does not preclude SARS-CoV-2 infection  and should not be used as the sole basis for treatment or other  patient management decisions.  A negative result may occur with  improper specimen collection / handling, submission of specimen other  than nasopharyngeal swab, presence of viral mutation(s) within the  areas targeted by this assay, and inadequate number of viral  copies  (<250 copies / mL). A negative result must be combined with clinical  observations, patient history, and epidemiological information. If result is POSITIVE SARS-CoV-2 target nucleic acids are DETECTED. The SARS-CoV-2 RNA is generally detectable in upper and lower  respiratory specimens dur ing the acute phase of infection.  Positive  results are indicative of active infection with SARS-CoV-2.  Clinical  correlation with patient history and other diagnostic information is  necessary to determine patient infection status.  Positive results do  not rule out bacterial infection or co-infection with other viruses. If result is PRESUMPTIVE POSTIVE SARS-CoV-2 nucleic acids MAY BE PRESENT.   A presumptive positive result was obtained on the submitted specimen  and confirmed on repeat testing.  While 2019 novel coronavirus  (SARS-CoV-2) nucleic acids may be present in the submitted sample  additional confirmatory testing may be necessary for  epidemiological  and / or clinical management purposes  to differentiate between  SARS-CoV-2 and other Sarbecovirus currently known to infect humans.  If clinically indicated additional testing with an alternate test  methodology [phone]) is advised. The SARS-CoV-2 RNA is generally  detectable in upper and lower respiratory sp ecimens during the acute  phase of infection. The expected result is Negative. Fact Sheet for Patients:  StrictlyIdeas.no Fact Sheet for Healthcare Providers: BankingDealers.co.za This test is not yet approved or cleared by the [place] FDA and has been authorized for detection and/or diagnosis of SARS-CoV-2 by FDA under an Emergency Use Authorization (EUA).  This EUA will remain in effect (meaning this test can be used) for the duration of the COVID-19 declaration under Section 564(b)(1) of the Act, 21 U.S.C. section 360bbb-3(b)(1), unless the authorization is terminated or revoked sooner. Performed at [facility], [place] [address], [place], [place] [address]      Labs: BNP (last 3 results) No results for input(s): BNP in the last 8760 hours. Basic Metabolic Panel: Recent Labs  Lab 08/14/18 0430 08/15/18 0434 08/16/18 0328 08/18/18 0404 08/19/18 0353  NA 138 137 138 136 135  K 3.5 3.4* 3.6 3.5 3.6  CL 109 110 110 105 103  CO2 22 21* 20* 20* 20*  GLUCOSE 88 88 88 75 111*  BUN 8 6* 6* 10 13  CREATININE 1.09 0.95 1.03 1.16 1.23  CALCIUM 8.9 9.1 9.6 9.7 9.8   Liver Function Tests: Recent Labs  Lab 08/12/18 1713  AST 27  ALT 31  ALKPHOS 65  BILITOT 1.0  PROT 6.5  ALBUMIN 4.0   No results for input(s): LIPASE, AMYLASE in the last 168 hours. No results for input(s): AMMONIA in the last 168 hours. CBC: Recent Labs  Lab 08/12/18 1713 08/13/18 0351 08/14/18 0430 08/15/18 0434 08/16/18 0328  WBC 7.7 8.2 6.5 5.9 6.1  NEUTROABS 5.5  --   --   --   --   HGB 16.2 15.4 14.9 15.6 16.2   HCT 46.9 45.7 43.4 45.7 48.4  MCV 85.9 87.2 86.5 85.9 86.9  PLT 156 133* 120* 120* 130*   Cardiac Enzymes: No results for input(s): CKTOTAL, CKMB, CKMBINDEX, TROPONINI in the last 168 hours. BNP: Invalid input(s): POCBNP CBG: Recent Labs  Lab 08/12/18 2219 08/13/18 0658  GLUCAP 91 84   D-Dimer No results for input(s): DDIMER in the last 72 hours. Hgb A1c No results for input(s): HGBA1C in the last 72 hours. Lipid Profile No results for input(s): CHOL, HDL, LDLCALC, TRIG, CHOLHDL, LDLDIRECT in the last 72 hours. Thyroid function studies No results for  input(s): TSH, T4TOTAL, T3FREE, THYROIDAB in the last 72 hours.  Invalid input(s): FREET3 Anemia work up No results for input(s): VITAMINB12, FOLATE, FERRITIN, TIBC, IRON, RETICCTPCT in the last 72 hours. Urinalysis No results found for: COLORURINE, APPEARANCEUR, [place], [place], GLUCOSEU, [place], [place], KETONESUR, PROTEINUR, UROBILINOGEN, NITRITE, LEUKOCYTESUR Sepsis Labs Invalid input(s): PROCALCITONIN,  WBC,  LACTICIDVEN Microbiology Recent Results (from the past 240 hour(s))  SARS Coronavirus 2 (CEPHEID - Performed in [facility] hospital lab), Hosp Order     Status: None   Collection Time: 08/12/18  7:21 PM   Specimen: Nasopharyngeal Swab  Result Value Ref Range Status   SARS Coronavirus 2 NEGATIVE NEGATIVE Final    Comment: (NOTE) If result is NEGATIVE SARS-CoV-2 target nucleic acids are NOT DETECTED. The SARS-CoV-2 RNA is generally detectable in upper and lower  respiratory specimens during the acute phase of infection. The lowest  concentration of SARS-CoV-2 viral copies this assay can detect is 250  copies / mL. A negative result does not preclude SARS-CoV-2 infection  and should not be used as the sole basis for treatment or other  patient management decisions.  A negative result may occur with  improper specimen collection / handling, submission of specimen other  than nasopharyngeal swab, presence of  viral mutation(s) within the  areas targeted by this assay, and inadequate number of viral copies  (<250 copies / mL). A negative result must be combined with clinical  observations, patient history, and epidemiological information. If result is POSITIVE SARS-CoV-2 target nucleic acids are DETECTED. The SARS-CoV-2 RNA is generally detectable in upper and lower  respiratory specimens dur ing the acute phase of infection.  Positive  results are indicative of active infection with SARS-CoV-2.  Clinical  correlation with patient history and other diagnostic information is  necessary to determine patient infection status.  Positive results do  not rule out bacterial infection or co-infection with other viruses. If result is PRESUMPTIVE POSTIVE SARS-CoV-2 nucleic acids MAY BE PRESENT.   A presumptive positive result was obtained on the submitted specimen  and confirmed on repeat testing.  While 2019 novel coronavirus  (SARS-CoV-2) nucleic acids may be present in the submitted sample  additional confirmatory testing may be necessary for epidemiological  and / or clinical management purposes  to differentiate between  SARS-CoV-2 and other Sarbecovirus currently known to infect humans.  If clinically indicated additional testing with an alternate test  methodology [phone]) is advised. The SARS-CoV-2 RNA is generally  detectable in upper and lower respiratory sp ecimens during the acute  phase of infection. The expected result is Negative. Fact Sheet for Patients:  StrictlyIdeas.no Fact Sheet for Healthcare Providers: BankingDealers.co.za This test is not yet approved or cleared by the [place] FDA and has been authorized for detection and/or diagnosis of SARS-CoV-2 by FDA under an Emergency Use Authorization (EUA).  This EUA will remain in effect (meaning this test can be used) for the duration of the COVID-19 declaration under Section  564(b)(1) of the Act, 21 U.S.C. section 360bbb-3(b)(1), unless the authorization is terminated or revoked sooner. Performed at [facility], [place] [address] [place], [place] [address]   SARS Coronavirus 2 [facility] order, Performed in [facility] hospital lab)     Status: None   Collection Time: 08/18/18  3:34 PM  Result Value Ref Range Status   SARS Coronavirus 2 NEGATIVE NEGATIVE Final    Comment: (NOTE) If result is NEGATIVE SARS-CoV-2 target nucleic acids are NOT DETECTED. The SARS-CoV-2 RNA is generally  detectable in upper and lower  respiratory specimens during the acute phase of infection. The lowest  concentration of SARS-CoV-2 viral copies this assay can detect is 250  copies / mL. A negative result does not preclude SARS-CoV-2 infection  and should not be used as the sole basis for treatment or other  patient management decisions.  A negative result may occur with  improper specimen collection / handling, submission of specimen other  than nasopharyngeal swab, presence of viral mutation(s) within the  areas targeted by this assay, and inadequate number of viral copies  (<250 copies / mL). A negative result must be combined with clinical  observations, patient history, and epidemiological information. If result is POSITIVE SARS-CoV-2 target nucleic acids are DETECTED. The SARS-CoV-2 RNA is generally detectable in upper and lower  respiratory specimens dur ing the acute phase of infection.  Positive  results are indicative of active infection with SARS-CoV-2.  Clinical  correlation with patient history and other diagnostic information is  necessary to determine patient infection status.  Positive results do  not rule out bacterial infection or co-infection with other viruses. If result is PRESUMPTIVE POSTIVE SARS-CoV-2 nucleic acids MAY BE PRESENT.   A presumptive positive result was obtained on the submitted specimen  and confirmed on repeat testing.  While 2019  novel coronavirus  (SARS-CoV-2) nucleic acids may be present in the submitted sample  additional confirmatory testing may be necessary for epidemiological  and / or clinical management purposes  to differentiate between  SARS-CoV-2 and other Sarbecovirus currently known to infect humans.  If clinically indicated additional testing with an alternate test  methodology [phone]) is advised. The SARS-CoV-2 RNA is generally  detectable in upper and lower respiratory sp ecimens during the acute  phase of infection. The expected result is Negative. Fact Sheet for Patients:  StrictlyIdeas.no Fact Sheet for Healthcare Providers: BankingDealers.co.za This test is not yet approved or cleared by the [place] FDA and has been authorized for detection and/or diagnosis of SARS-CoV-2 by FDA under an Emergency Use Authorization (EUA).  This EUA will remain in effect (meaning this test can be used) for the duration of the COVID-19 declaration under Section 564(b)(1) of the Act, 21 U.S.C. section 360bbb-3(b)(1), unless the authorization is terminated or revoked sooner. Performed at [facility], [place] [address], [place], [place] [address]      Time coordinating discharge: 35 minutes  SIGNED:   Aline August, MD  [facility] 08/19/2018, 10:51 AM

## 2018-08-19 NOTE — Progress Notes (Signed)
Austin Mora to be discharged Home per MD order. Discussed prescriptions and follow up appointments with the patient. Prescriptions given to patient; medication list explained in detail. Patient verbalized understanding.  Skin clean, dry and intact without evidence of skin break down, no evidence of skin tears noted. IV catheter discontinued intact. Site without signs and symptoms of complications. Dressing and pressure applied. Pt denies pain at the site currently. No complaints noted.  Patient free of lines, drains, and wounds.   An After Visit Summary (AVS) was printed and given to the patient. Patient escorted via wheelchair, and discharged home via private auto.  Shela Commons, RN

## 2018-08-21 DIAGNOSIS — E119 Type 2 diabetes mellitus without complications: Secondary | ICD-10-CM | POA: Diagnosis not present

## 2018-08-21 DIAGNOSIS — I10 Essential (primary) hypertension: Secondary | ICD-10-CM | POA: Diagnosis not present

## 2018-08-21 DIAGNOSIS — F0390 Unspecified dementia without behavioral disturbance: Secondary | ICD-10-CM | POA: Diagnosis not present

## 2018-08-21 DIAGNOSIS — Z955 Presence of coronary angioplasty implant and graft: Secondary | ICD-10-CM | POA: Diagnosis not present

## 2018-08-21 DIAGNOSIS — E785 Hyperlipidemia, unspecified: Secondary | ICD-10-CM | POA: Diagnosis not present

## 2018-08-21 DIAGNOSIS — I6521 Occlusion and stenosis of right carotid artery: Secondary | ICD-10-CM | POA: Diagnosis not present

## 2018-08-21 DIAGNOSIS — I255 Ischemic cardiomyopathy: Secondary | ICD-10-CM | POA: Diagnosis not present

## 2018-08-21 DIAGNOSIS — I2109 ST elevation (STEMI) myocardial infarction involving other coronary artery of anterior wall: Secondary | ICD-10-CM | POA: Diagnosis not present

## 2018-08-21 DIAGNOSIS — K219 Gastro-esophageal reflux disease without esophagitis: Secondary | ICD-10-CM | POA: Diagnosis not present

## 2018-08-21 DIAGNOSIS — F320 Major depressive disorder, single episode, mild: Secondary | ICD-10-CM | POA: Diagnosis not present

## 2018-08-24 DIAGNOSIS — F0390 Unspecified dementia without behavioral disturbance: Secondary | ICD-10-CM | POA: Diagnosis not present

## 2018-08-24 DIAGNOSIS — E119 Type 2 diabetes mellitus without complications: Secondary | ICD-10-CM | POA: Diagnosis not present

## 2018-08-24 DIAGNOSIS — I255 Ischemic cardiomyopathy: Secondary | ICD-10-CM | POA: Diagnosis not present

## 2018-08-24 DIAGNOSIS — I2109 ST elevation (STEMI) myocardial infarction involving other coronary artery of anterior wall: Secondary | ICD-10-CM | POA: Diagnosis not present

## 2018-08-24 DIAGNOSIS — I6521 Occlusion and stenosis of right carotid artery: Secondary | ICD-10-CM | POA: Diagnosis not present

## 2018-08-24 DIAGNOSIS — K219 Gastro-esophageal reflux disease without esophagitis: Secondary | ICD-10-CM | POA: Diagnosis not present

## 2018-08-24 DIAGNOSIS — E785 Hyperlipidemia, unspecified: Secondary | ICD-10-CM | POA: Diagnosis not present

## 2018-08-24 DIAGNOSIS — F320 Major depressive disorder, single episode, mild: Secondary | ICD-10-CM | POA: Diagnosis not present

## 2018-08-24 DIAGNOSIS — I10 Essential (primary) hypertension: Secondary | ICD-10-CM | POA: Diagnosis not present

## 2018-08-26 DIAGNOSIS — Z720 Tobacco use: Secondary | ICD-10-CM | POA: Diagnosis not present

## 2018-08-26 DIAGNOSIS — I2102 ST elevation (STEMI) myocardial infarction involving left anterior descending coronary artery: Secondary | ICD-10-CM | POA: Diagnosis not present

## 2018-08-26 DIAGNOSIS — K219 Gastro-esophageal reflux disease without esophagitis: Secondary | ICD-10-CM | POA: Diagnosis not present

## 2018-08-26 DIAGNOSIS — R634 Abnormal weight loss: Secondary | ICD-10-CM | POA: Diagnosis not present

## 2018-08-26 DIAGNOSIS — R413 Other amnesia: Secondary | ICD-10-CM | POA: Diagnosis not present

## 2018-08-26 DIAGNOSIS — F329 Major depressive disorder, single episode, unspecified: Secondary | ICD-10-CM | POA: Diagnosis not present

## 2018-08-26 DIAGNOSIS — I2510 Atherosclerotic heart disease of native coronary artery without angina pectoris: Secondary | ICD-10-CM | POA: Diagnosis not present

## 2018-08-26 DIAGNOSIS — E785 Hyperlipidemia, unspecified: Secondary | ICD-10-CM | POA: Diagnosis not present

## 2018-08-26 DIAGNOSIS — I951 Orthostatic hypotension: Secondary | ICD-10-CM | POA: Diagnosis not present

## 2018-08-27 DIAGNOSIS — E119 Type 2 diabetes mellitus without complications: Secondary | ICD-10-CM | POA: Diagnosis not present

## 2018-08-27 DIAGNOSIS — I2109 ST elevation (STEMI) myocardial infarction involving other coronary artery of anterior wall: Secondary | ICD-10-CM | POA: Diagnosis not present

## 2018-08-27 DIAGNOSIS — F320 Major depressive disorder, single episode, mild: Secondary | ICD-10-CM | POA: Diagnosis not present

## 2018-08-27 DIAGNOSIS — I10 Essential (primary) hypertension: Secondary | ICD-10-CM | POA: Diagnosis not present

## 2018-08-27 DIAGNOSIS — I255 Ischemic cardiomyopathy: Secondary | ICD-10-CM | POA: Diagnosis not present

## 2018-08-27 DIAGNOSIS — E785 Hyperlipidemia, unspecified: Secondary | ICD-10-CM | POA: Diagnosis not present

## 2018-08-27 DIAGNOSIS — I6521 Occlusion and stenosis of right carotid artery: Secondary | ICD-10-CM | POA: Diagnosis not present

## 2018-08-27 DIAGNOSIS — F0390 Unspecified dementia without behavioral disturbance: Secondary | ICD-10-CM | POA: Diagnosis not present

## 2018-08-27 DIAGNOSIS — K219 Gastro-esophageal reflux disease without esophagitis: Secondary | ICD-10-CM | POA: Diagnosis not present

## 2018-08-28 DIAGNOSIS — I2109 ST elevation (STEMI) myocardial infarction involving other coronary artery of anterior wall: Secondary | ICD-10-CM | POA: Diagnosis not present

## 2018-08-28 DIAGNOSIS — E119 Type 2 diabetes mellitus without complications: Secondary | ICD-10-CM | POA: Diagnosis not present

## 2018-08-28 DIAGNOSIS — F320 Major depressive disorder, single episode, mild: Secondary | ICD-10-CM | POA: Diagnosis not present

## 2018-08-28 DIAGNOSIS — K219 Gastro-esophageal reflux disease without esophagitis: Secondary | ICD-10-CM | POA: Diagnosis not present

## 2018-08-28 DIAGNOSIS — I10 Essential (primary) hypertension: Secondary | ICD-10-CM | POA: Diagnosis not present

## 2018-08-28 DIAGNOSIS — E785 Hyperlipidemia, unspecified: Secondary | ICD-10-CM | POA: Diagnosis not present

## 2018-08-28 DIAGNOSIS — I255 Ischemic cardiomyopathy: Secondary | ICD-10-CM | POA: Diagnosis not present

## 2018-08-28 DIAGNOSIS — F0390 Unspecified dementia without behavioral disturbance: Secondary | ICD-10-CM | POA: Diagnosis not present

## 2018-08-28 DIAGNOSIS — I6521 Occlusion and stenosis of right carotid artery: Secondary | ICD-10-CM | POA: Diagnosis not present

## 2018-08-29 DIAGNOSIS — I10 Essential (primary) hypertension: Secondary | ICD-10-CM | POA: Diagnosis not present

## 2018-08-29 DIAGNOSIS — I2109 ST elevation (STEMI) myocardial infarction involving other coronary artery of anterior wall: Secondary | ICD-10-CM | POA: Diagnosis not present

## 2018-08-29 DIAGNOSIS — K219 Gastro-esophageal reflux disease without esophagitis: Secondary | ICD-10-CM | POA: Diagnosis not present

## 2018-08-29 DIAGNOSIS — F0390 Unspecified dementia without behavioral disturbance: Secondary | ICD-10-CM | POA: Diagnosis not present

## 2018-08-29 DIAGNOSIS — E785 Hyperlipidemia, unspecified: Secondary | ICD-10-CM | POA: Diagnosis not present

## 2018-08-29 DIAGNOSIS — I255 Ischemic cardiomyopathy: Secondary | ICD-10-CM | POA: Diagnosis not present

## 2018-08-29 DIAGNOSIS — I6521 Occlusion and stenosis of right carotid artery: Secondary | ICD-10-CM | POA: Diagnosis not present

## 2018-08-29 DIAGNOSIS — E119 Type 2 diabetes mellitus without complications: Secondary | ICD-10-CM | POA: Diagnosis not present

## 2018-08-29 DIAGNOSIS — F320 Major depressive disorder, single episode, mild: Secondary | ICD-10-CM | POA: Diagnosis not present

## 2018-09-01 ENCOUNTER — Telehealth: Payer: Self-pay | Admitting: Cardiology

## 2018-09-01 ENCOUNTER — Ambulatory Visit (INDEPENDENT_AMBULATORY_CARE_PROVIDER_SITE_OTHER): Payer: Medicare PPO | Admitting: Cardiology

## 2018-09-01 ENCOUNTER — Other Ambulatory Visit: Payer: Self-pay

## 2018-09-01 VITALS — BP 110/64 | HR 93 | Temp 98.2°F | Ht 69.0 in | Wt 147.6 lb

## 2018-09-01 DIAGNOSIS — I2102 ST elevation (STEMI) myocardial infarction involving left anterior descending coronary artery: Secondary | ICD-10-CM

## 2018-09-01 DIAGNOSIS — R413 Other amnesia: Secondary | ICD-10-CM | POA: Diagnosis not present

## 2018-09-01 DIAGNOSIS — E119 Type 2 diabetes mellitus without complications: Secondary | ICD-10-CM | POA: Diagnosis not present

## 2018-09-01 DIAGNOSIS — F320 Major depressive disorder, single episode, mild: Secondary | ICD-10-CM | POA: Diagnosis not present

## 2018-09-01 DIAGNOSIS — I959 Hypotension, unspecified: Secondary | ICD-10-CM

## 2018-09-01 DIAGNOSIS — F0390 Unspecified dementia without behavioral disturbance: Secondary | ICD-10-CM | POA: Diagnosis not present

## 2018-09-01 DIAGNOSIS — I255 Ischemic cardiomyopathy: Secondary | ICD-10-CM | POA: Diagnosis not present

## 2018-09-01 DIAGNOSIS — K219 Gastro-esophageal reflux disease without esophagitis: Secondary | ICD-10-CM | POA: Diagnosis not present

## 2018-09-01 DIAGNOSIS — I2109 ST elevation (STEMI) myocardial infarction involving other coronary artery of anterior wall: Secondary | ICD-10-CM | POA: Diagnosis not present

## 2018-09-01 DIAGNOSIS — E785 Hyperlipidemia, unspecified: Secondary | ICD-10-CM | POA: Diagnosis not present

## 2018-09-01 DIAGNOSIS — I10 Essential (primary) hypertension: Secondary | ICD-10-CM | POA: Diagnosis not present

## 2018-09-01 DIAGNOSIS — I6521 Occlusion and stenosis of right carotid artery: Secondary | ICD-10-CM | POA: Diagnosis not present

## 2018-09-01 NOTE — Patient Instructions (Addendum)
Medication Instructions:  Your physician recommends that you continue on your current medications as directed. Please refer to the Current Medication list given to you today. If you need a refill on your cardiac medications before your next appointment, please call your pharmacy.   Lab work: Your physician recommends that you return for lab work in: TODAY-BMET, CBC If you have labs (blood work) drawn today and your tests are completely normal, you will receive your results only by: Marland Kitchen MyChart Message (if you have MyChart) OR . A paper copy in the mail If you have any lab test that is abnormal or we need to change your treatment, we will call you to review the results.  Testing/Procedures: None   Follow-Up: At [facility], you and your health needs are our priority.  As part of our continuing mission to provide you with exceptional heart care, we have created designated Provider Care Teams.  These Care Teams include your primary Cardiologist (physician) and Advanced Practice Providers (APPs -  Physician Assistants and Nurse Practitioners) who all work together to provide you with the care you need, when you need it.  . Your physician recommends that you schedule a follow-up appointment in: 4-6 weeks with Dr Claiborne Billings or Kerin Ransom, PA-C  Any Other Special Instructions Will Be Listed Below (If Applicable).

## 2018-09-01 NOTE — Assessment & Plan Note (Signed)
EF 30 - 35% 

## 2018-09-01 NOTE — Assessment & Plan Note (Signed)
Memory issues and depression

## 2018-09-01 NOTE — Progress Notes (Signed)
Cardiology Office Note:    Date:  09/01/2018   ID:  [name], DOB 23-Apr-1945, MRN [id number]  PCP:  Leanna Battles, MD  Cardiologist:  Shelva Majestic, MD  Electrophysiologist:  None   Referring MD: Leanna Battles, MD   No chief complaint on file.   History of Present Illness:    Austin Mora is a 73 y.o. male with a hx of CAD, MI, and recent PCI.  He presented 07/28/2018 with an anterior STEMI.  He underwent placement of an LAD DES. His EF was depressed 30-35%.  After admission it became clear the patient was significantly weak and debilitated.  He had been on medication for memory  Loss.  He was discharged to SNF for rehab.  He was discharged from rehab 08/05/2018 and re admitted 08/12/2018 after he had syncope at home smoking a cigarette. He was noted to be hypotensive. His medications were adjusted and he was discharged back home on Midodrine.    He is in the office today for follow up.  His daughter was on the phone through the visit, the patient's wife has a speech deficit.  The daughter and wife are concerned about the patient loosing weight.  He says he has no appetite though he does eat some at home.  His weight in Oct 2019 was 191 lbs- June 171 lbs- 7/15 166 lbs- today 147 lbs.    His PCP has recently adjusted his Abilify and Lexapro secondary to daytime sleepiness. The patient denies chest pain or dyspnea.  He continues to smoke.  His affect is flat.   Past Medical History:  Diagnosis Date  . Depression   . Diabetes mellitus    type 2  . GERD (gastroesophageal reflux disease)   . Hyperlipidemia   . Hypertension   . Memory disorder 07/01/2018  . STEMI (ST elevation myocardial infarction) ([place])    07/31/18 PCI/DES x1 to LAD, EF 30-35%    Past Surgical History:  Procedure Laterality Date  . APPENDECTOMY  1960  . CHOLECYSTECTOMY  1995  . CORONARY STENT INTERVENTION N/A 07/28/2018   Procedure: CORONARY STENT INTERVENTION;  Surgeon: Troy Sine, MD;  Location: [facility];  Service: Cardiovascular;  Laterality: N/A;  . CORONARY/GRAFT ACUTE MI REVASCULARIZATION N/A 07/28/2018   Procedure: Coronary/Graft Acute MI Revascularization;  Surgeon: Troy Sine, MD;  Location: [facility];  Service: Cardiovascular;  Laterality: N/A;  . HEMORROIDECTOMY  1998  . LEFT HEART CATH AND CORONARY ANGIOGRAPHY N/A 07/28/2018   Procedure: LEFT HEART CATH AND CORONARY ANGIOGRAPHY;  Surgeon: Troy Sine, MD;  Location: [facility];  Service: Cardiovascular;  Laterality: N/A;  . SHOULDER ARTHROSCOPY W/ ROTATOR CUFF REPAIR  2010   left    Current Medications: Current Meds  Medication Sig  . ALPRAZolam (XANAX) 0.5 MG tablet Take 0.5 mg by mouth daily as needed for anxiety (or agitation).  . ARIPiprazole (ABILIFY) 5 MG tablet Take 2.5 mg by mouth daily.   Marland Kitchen aspirin 81 MG tablet Take 81 mg by mouth daily.  Marland Kitchen escitalopram (LEXAPRO) 10 MG tablet Take 10 mg by mouth daily.  . midodrine (PROAMATINE) 5 MG tablet Take 1 tablet (5 mg total) by mouth 3 (three) times daily with meals.  . Nicotine (NICODERM CQ TD) Place 1 patch onto the skin daily as needed.  . nitroGLYCERIN (NITROSTAT) 0.4 MG SL tablet Place 1 tablet (0.4 mg total) under the tongue every 5 (five) minutes as needed.  Marland Kitchen  omeprazole (PRILOSEC) 40 MG capsule Take 40 mg by mouth daily before breakfast.   . rosuvastatin (CRESTOR) 40 MG tablet Take 1 tablet (40 mg total) by mouth daily at 6 PM. (Patient taking differently: Take 40 mg by mouth every evening. )  . ticagrelor (BRILINTA) 90 MG TABS tablet Take 1 tablet (90 mg total) by mouth 2 (two) times daily.     Allergies:   Aricept [donepezil hcl] and Sulfa antibiotics   Social History   Socioeconomic History  . Marital status: Single    Spouse name: Not on file  . Number of children: Not on file  . Years of education: 80  . Highest education level: Not on file  Occupational History  . Occupation: Part time - [employer]  Social  Needs  . Financial resource strain: Not on file  . Food insecurity    Worry: Not on file    Inability: Not on file  . Transportation needs    Medical: Not on file    Non-medical: Not on file  Tobacco Use  . Smoking status: Current Every Day Smoker    Packs/day: 0.50  . Smokeless tobacco: Never Used  Substance and Sexual Activity  . Alcohol use: No  . Drug use: No  . Sexual activity: Not on file  Lifestyle  . Physical activity    Days per week: Not on file    Minutes per session: Not on file  . Stress: Not on file  Relationships  . Social Herbalist on phone: Not on file    Gets together: Not on file    Attends religious service: Not on file    Active member of club or organization: Not on file    Attends meetings of clubs or organizations: Not on file    Relationship status: Not on file  Other Topics Concern  . Not on file  Social History Narrative   Lives with wife   Caffeine use: 2 cups per day (coffee)   Left handed      Family History: The patient's family history includes Heart attack in his brother and father.  ROS:   Please see the history of present illness.     All other systems reviewed and are negative.  EKGs/Labs/Other Studies Reviewed:    The following studies were reviewed today: Cath/ PCI 07/28/2018  EKG:  EKG is ordered today.  The ekg ordered today demonstrates NSR, PACs, LVH, lateral TWI, HR 93  Recent Labs: 08/12/2018: ALT 31 08/16/2018: Hemoglobin 16.2; Platelets 130 08/19/2018: BUN 13; Creatinine, Ser 1.23; Potassium 3.6; Sodium 135  Recent Lipid Panel    Component Value Date/Time   CHOL 76 07/28/2018 1546   TRIG 95 07/28/2018 1546   HDL 28 (L) 07/28/2018 1546   CHOLHDL 2.7 07/28/2018 1546   VLDL 19 07/28/2018 1546   LDLCALC 29 07/28/2018 1546    Physical Exam:    VS:  BP 110/64   Pulse 93   Temp 98.2 F (36.8 C) (Temporal)   Ht 5\' 9"  (1.753 m)   Wt 147 lb 9.6 oz (67 kg)   SpO2 99%   BMI 21.80 kg/m     Wt Readings  from Last 3 Encounters:  09/01/18 147 lb 9.6 oz (67 kg)  08/19/18 166 lb 7.2 oz (75.5 kg)  08/05/18 161 lb (73 kg)     GEN: thin Caucasian male no acute distress HEENT: Normal NECK: No JVD; No carotid bruits LYMPHATICS: No lymphadenopathy CARDIAC: RRR, no  murmurs, rubs, gallops RESPIRATORY:  Scattered rhonchi ABDOMEN: Soft, non-tender, non-distended MUSCULOSKELETAL:  No edema; No deformity  SKIN: Warm and dry NEUROLOGIC:  Alert and oriented x 3 PSYCHIATRIC:  Normal affect   ASSESSMENT:    ST elevation myocardial infarction involving left anterior descending (LAD) coronary artery (HCC) Anterior STEMI- LAD PCI with DES 07/28/2018  Ischemic cardiomyopathy EF 30-35%  Hypotension Now on Midodrine-off ARB and BB  Memory disorder Memory issues and depression  PLAN:    I ordered a CBC and BMP today.  I can't explain the 20 lb weight loss since 08/19/2018 and I told the pt's daughter that I felt his one of his weights was most likely inaccurate. He has a f/u with his PCP next week. I'll see him back in a few weeks.  I discussed smoking cessation but he did not acknowledge this.  Consider adding a BB next OV.     Medication Adjustments/Labs and Tests Ordered: Current medicines are reviewed at length with the patient today.  Concerns regarding medicines are outlined above.  Orders Placed This Encounter  Procedures  . CBC with Differential  . Basic Metabolic Panel (BMET)  . EKG 12-Lead   No orders of the defined types were placed in this encounter.   Patient Instructions  Medication Instructions:  Your physician recommends that you continue on your current medications as directed. Please refer to the Current Medication list given to you today. If you need a refill on your cardiac medications before your next appointment, please call your pharmacy.   Lab work: Your physician recommends that you return for lab work in: TODAY-BMET, CBC If you have labs (blood work) drawn today  and your tests are completely normal, you will receive your results only by: Marland Kitchen MyChart Message (if you have MyChart) OR . A paper copy in the mail If you have any lab test that is abnormal or we need to change your treatment, we will call you to review the results.  Testing/Procedures: None   Follow-Up: At [facility], you and your health needs are our priority.  As part of our continuing mission to provide you with exceptional heart care, we have created designated Provider Care Teams.  These Care Teams include your primary Cardiologist (physician) and Advanced Practice Providers (APPs -  Physician Assistants and Nurse Practitioners) who all work together to provide you with the care you need, when you need it.  . Your physician recommends that you schedule a follow-up appointment in: 4-6 weeks with Dr Claiborne Billings or Kerin Ransom, PA-C  Any Other Special Instructions Will Be Listed Below (If Applicable).      Signed, Kerin Ransom, PA-C  09/01/2018 [facility]

## 2018-09-01 NOTE — Assessment & Plan Note (Signed)
Anterior STEMI- LAD PCI with DES 07/28/2018

## 2018-09-01 NOTE — Assessment & Plan Note (Signed)
Now on Midodrine-off ARB and BB

## 2018-09-01 NOTE — Telephone Encounter (Signed)
I called pt, he ws not available.

## 2018-09-02 DIAGNOSIS — E785 Hyperlipidemia, unspecified: Secondary | ICD-10-CM | POA: Diagnosis not present

## 2018-09-02 DIAGNOSIS — F0390 Unspecified dementia without behavioral disturbance: Secondary | ICD-10-CM | POA: Diagnosis not present

## 2018-09-02 DIAGNOSIS — I255 Ischemic cardiomyopathy: Secondary | ICD-10-CM | POA: Diagnosis not present

## 2018-09-02 DIAGNOSIS — I10 Essential (primary) hypertension: Secondary | ICD-10-CM | POA: Diagnosis not present

## 2018-09-02 DIAGNOSIS — I6521 Occlusion and stenosis of right carotid artery: Secondary | ICD-10-CM | POA: Diagnosis not present

## 2018-09-02 DIAGNOSIS — F320 Major depressive disorder, single episode, mild: Secondary | ICD-10-CM | POA: Diagnosis not present

## 2018-09-02 DIAGNOSIS — I2109 ST elevation (STEMI) myocardial infarction involving other coronary artery of anterior wall: Secondary | ICD-10-CM | POA: Diagnosis not present

## 2018-09-02 DIAGNOSIS — E119 Type 2 diabetes mellitus without complications: Secondary | ICD-10-CM | POA: Diagnosis not present

## 2018-09-02 DIAGNOSIS — K219 Gastro-esophageal reflux disease without esophagitis: Secondary | ICD-10-CM | POA: Diagnosis not present

## 2018-09-02 LAB — CBC WITH DIFFERENTIAL/PLATELET
Basophils Absolute: 0.1 10*3/uL (ref 0.0–0.2)
Basos: 1 %
EOS (ABSOLUTE): 0.1 10*3/uL (ref 0.0–0.4)
Eos: 1 %
Hematocrit: 50.8 % (ref 37.5–51.0)
Hemoglobin: 18.1 g/dL — ABNORMAL HIGH (ref 13.0–17.7)
Immature Grans (Abs): 0 10*3/uL (ref 0.0–0.1)
Immature Granulocytes: 0 %
Lymphocytes Absolute: 1.4 10*3/uL (ref 0.7–3.1)
Lymphs: 16 %
MCH: 29.9 pg (ref 26.6–33.0)
MCHC: 35.6 g/dL (ref 31.5–35.7)
MCV: 84 fL (ref 79–97)
Monocytes Absolute: 0.7 10*3/uL (ref 0.1–0.9)
Monocytes: 8 %
Neutrophils Absolute: 6.8 10*3/uL (ref 1.4–7.0)
Neutrophils: 74 %
Platelets: 212 10*3/uL (ref 150–450)
RBC: 6.05 x10E6/uL — ABNORMAL HIGH (ref 4.14–5.80)
RDW: 13.7 % (ref 11.6–15.4)
WBC: 9.1 10*3/uL (ref 3.4–10.8)

## 2018-09-02 LAB — BASIC METABOLIC PANEL
BUN/Creatinine Ratio: 11 (ref 10–24)
BUN: 20 mg/dL (ref 8–27)
CO2: 17 mmol/L — ABNORMAL LOW (ref 20–29)
Calcium: 10.4 mg/dL — ABNORMAL HIGH (ref 8.6–10.2)
Chloride: 94 mmol/L — ABNORMAL LOW (ref 96–106)
Creatinine, Ser: 1.8 mg/dL — ABNORMAL HIGH (ref 0.76–1.27)
GFR calc Af Amer: 42 mL/min/{1.73_m2} — ABNORMAL LOW (ref >59)
GFR calc non Af Amer: 36 mL/min/{1.73_m2} — ABNORMAL LOW (ref >59)
Glucose: 109 mg/dL — ABNORMAL HIGH (ref 65–99)
Potassium: 4.4 mmol/L (ref 3.5–5.2)
Sodium: 137 mmol/L (ref 134–144)

## 2018-09-03 DIAGNOSIS — E785 Hyperlipidemia, unspecified: Secondary | ICD-10-CM | POA: Diagnosis not present

## 2018-09-03 DIAGNOSIS — I10 Essential (primary) hypertension: Secondary | ICD-10-CM | POA: Diagnosis not present

## 2018-09-03 DIAGNOSIS — F0390 Unspecified dementia without behavioral disturbance: Secondary | ICD-10-CM | POA: Diagnosis not present

## 2018-09-03 DIAGNOSIS — I255 Ischemic cardiomyopathy: Secondary | ICD-10-CM | POA: Diagnosis not present

## 2018-09-03 DIAGNOSIS — K219 Gastro-esophageal reflux disease without esophagitis: Secondary | ICD-10-CM | POA: Diagnosis not present

## 2018-09-03 DIAGNOSIS — I6521 Occlusion and stenosis of right carotid artery: Secondary | ICD-10-CM | POA: Diagnosis not present

## 2018-09-03 DIAGNOSIS — I2109 ST elevation (STEMI) myocardial infarction involving other coronary artery of anterior wall: Secondary | ICD-10-CM | POA: Diagnosis not present

## 2018-09-03 DIAGNOSIS — F320 Major depressive disorder, single episode, mild: Secondary | ICD-10-CM | POA: Diagnosis not present

## 2018-09-03 DIAGNOSIS — E119 Type 2 diabetes mellitus without complications: Secondary | ICD-10-CM | POA: Diagnosis not present

## 2018-09-04 DIAGNOSIS — F0390 Unspecified dementia without behavioral disturbance: Secondary | ICD-10-CM | POA: Diagnosis not present

## 2018-09-04 DIAGNOSIS — I10 Essential (primary) hypertension: Secondary | ICD-10-CM | POA: Diagnosis not present

## 2018-09-04 DIAGNOSIS — F320 Major depressive disorder, single episode, mild: Secondary | ICD-10-CM | POA: Diagnosis not present

## 2018-09-04 DIAGNOSIS — I2109 ST elevation (STEMI) myocardial infarction involving other coronary artery of anterior wall: Secondary | ICD-10-CM | POA: Diagnosis not present

## 2018-09-04 DIAGNOSIS — I6521 Occlusion and stenosis of right carotid artery: Secondary | ICD-10-CM | POA: Diagnosis not present

## 2018-09-04 DIAGNOSIS — E119 Type 2 diabetes mellitus without complications: Secondary | ICD-10-CM | POA: Diagnosis not present

## 2018-09-04 DIAGNOSIS — K219 Gastro-esophageal reflux disease without esophagitis: Secondary | ICD-10-CM | POA: Diagnosis not present

## 2018-09-04 DIAGNOSIS — I255 Ischemic cardiomyopathy: Secondary | ICD-10-CM | POA: Diagnosis not present

## 2018-09-04 DIAGNOSIS — E785 Hyperlipidemia, unspecified: Secondary | ICD-10-CM | POA: Diagnosis not present

## 2018-09-07 ENCOUNTER — Other Ambulatory Visit: Payer: Self-pay | Admitting: Internal Medicine

## 2018-09-07 DIAGNOSIS — F0390 Unspecified dementia without behavioral disturbance: Secondary | ICD-10-CM | POA: Diagnosis not present

## 2018-09-07 DIAGNOSIS — K219 Gastro-esophageal reflux disease without esophagitis: Secondary | ICD-10-CM | POA: Diagnosis not present

## 2018-09-07 DIAGNOSIS — Z720 Tobacco use: Secondary | ICD-10-CM

## 2018-09-07 DIAGNOSIS — I10 Essential (primary) hypertension: Secondary | ICD-10-CM | POA: Diagnosis not present

## 2018-09-07 DIAGNOSIS — E119 Type 2 diabetes mellitus without complications: Secondary | ICD-10-CM | POA: Diagnosis not present

## 2018-09-07 DIAGNOSIS — I6521 Occlusion and stenosis of right carotid artery: Secondary | ICD-10-CM | POA: Diagnosis not present

## 2018-09-07 DIAGNOSIS — I255 Ischemic cardiomyopathy: Secondary | ICD-10-CM | POA: Diagnosis not present

## 2018-09-07 DIAGNOSIS — I2109 ST elevation (STEMI) myocardial infarction involving other coronary artery of anterior wall: Secondary | ICD-10-CM | POA: Diagnosis not present

## 2018-09-07 DIAGNOSIS — F320 Major depressive disorder, single episode, mild: Secondary | ICD-10-CM | POA: Diagnosis not present

## 2018-09-07 DIAGNOSIS — E785 Hyperlipidemia, unspecified: Secondary | ICD-10-CM | POA: Diagnosis not present

## 2018-09-08 DIAGNOSIS — K219 Gastro-esophageal reflux disease without esophagitis: Secondary | ICD-10-CM | POA: Diagnosis not present

## 2018-09-08 DIAGNOSIS — I2109 ST elevation (STEMI) myocardial infarction involving other coronary artery of anterior wall: Secondary | ICD-10-CM | POA: Diagnosis not present

## 2018-09-08 DIAGNOSIS — I255 Ischemic cardiomyopathy: Secondary | ICD-10-CM | POA: Diagnosis not present

## 2018-09-08 DIAGNOSIS — I6521 Occlusion and stenosis of right carotid artery: Secondary | ICD-10-CM | POA: Diagnosis not present

## 2018-09-08 DIAGNOSIS — F320 Major depressive disorder, single episode, mild: Secondary | ICD-10-CM | POA: Diagnosis not present

## 2018-09-08 DIAGNOSIS — I10 Essential (primary) hypertension: Secondary | ICD-10-CM | POA: Diagnosis not present

## 2018-09-08 DIAGNOSIS — E119 Type 2 diabetes mellitus without complications: Secondary | ICD-10-CM | POA: Diagnosis not present

## 2018-09-08 DIAGNOSIS — F0390 Unspecified dementia without behavioral disturbance: Secondary | ICD-10-CM | POA: Diagnosis not present

## 2018-09-08 DIAGNOSIS — E785 Hyperlipidemia, unspecified: Secondary | ICD-10-CM | POA: Diagnosis not present

## 2018-09-10 ENCOUNTER — Telehealth: Payer: Self-pay | Admitting: Cardiovascular Disease

## 2018-09-10 DIAGNOSIS — I2109 ST elevation (STEMI) myocardial infarction involving other coronary artery of anterior wall: Secondary | ICD-10-CM | POA: Diagnosis not present

## 2018-09-10 DIAGNOSIS — E785 Hyperlipidemia, unspecified: Secondary | ICD-10-CM | POA: Diagnosis not present

## 2018-09-10 DIAGNOSIS — K219 Gastro-esophageal reflux disease without esophagitis: Secondary | ICD-10-CM | POA: Diagnosis not present

## 2018-09-10 DIAGNOSIS — I10 Essential (primary) hypertension: Secondary | ICD-10-CM | POA: Diagnosis not present

## 2018-09-10 DIAGNOSIS — F320 Major depressive disorder, single episode, mild: Secondary | ICD-10-CM | POA: Diagnosis not present

## 2018-09-10 DIAGNOSIS — I6521 Occlusion and stenosis of right carotid artery: Secondary | ICD-10-CM | POA: Diagnosis not present

## 2018-09-10 DIAGNOSIS — F0390 Unspecified dementia without behavioral disturbance: Secondary | ICD-10-CM | POA: Diagnosis not present

## 2018-09-10 DIAGNOSIS — E119 Type 2 diabetes mellitus without complications: Secondary | ICD-10-CM | POA: Diagnosis not present

## 2018-09-10 DIAGNOSIS — I255 Ischemic cardiomyopathy: Secondary | ICD-10-CM | POA: Diagnosis not present

## 2018-09-10 NOTE — Telephone Encounter (Signed)
Called and notified daughter. They will monitor as well as PCP.

## 2018-09-10 NOTE — Telephone Encounter (Signed)
We are okay with therapy if recommended by PCP that will monitor patient closely.

## 2018-09-10 NOTE — Telephone Encounter (Signed)
Dr.Kelly is in the hospital and may not get to his messages, anyway to assist with if okay to take? Thanks!

## 2018-09-10 NOTE — Telephone Encounter (Signed)
Spoke with daughter who report pt's pcp wanted to start him on megace 400 mg BID x 30 days to help with his appetite. She report pcp made her aware that once of it's side effect is that it can cause clotting. Daughter is concerned and would like Dr. Evette Georges recommendations as to whether pt she take medication.   Will route to MD

## 2018-09-10 NOTE — Telephone Encounter (Signed)
New Message     Pt c/o medication issue:  1. Name of Medication: Megace   2. How are you currently taking this medication (dosage and times per day)? 400 mg 2x daily, not currently taking but wants him to take for 30 days   3. Are you having a reaction (difficulty breathing--STAT)?   4. What is your medication issue? Pts daughter is calling and says they want to start him on this medication but she wants to make sure its okay  Please call

## 2018-09-11 DIAGNOSIS — I2109 ST elevation (STEMI) myocardial infarction involving other coronary artery of anterior wall: Secondary | ICD-10-CM | POA: Diagnosis not present

## 2018-09-11 DIAGNOSIS — F0390 Unspecified dementia without behavioral disturbance: Secondary | ICD-10-CM | POA: Diagnosis not present

## 2018-09-11 DIAGNOSIS — I6521 Occlusion and stenosis of right carotid artery: Secondary | ICD-10-CM | POA: Diagnosis not present

## 2018-09-11 DIAGNOSIS — F320 Major depressive disorder, single episode, mild: Secondary | ICD-10-CM | POA: Diagnosis not present

## 2018-09-11 DIAGNOSIS — I255 Ischemic cardiomyopathy: Secondary | ICD-10-CM | POA: Diagnosis not present

## 2018-09-11 DIAGNOSIS — K219 Gastro-esophageal reflux disease without esophagitis: Secondary | ICD-10-CM | POA: Diagnosis not present

## 2018-09-11 DIAGNOSIS — I10 Essential (primary) hypertension: Secondary | ICD-10-CM | POA: Diagnosis not present

## 2018-09-11 DIAGNOSIS — E119 Type 2 diabetes mellitus without complications: Secondary | ICD-10-CM | POA: Diagnosis not present

## 2018-09-11 DIAGNOSIS — E785 Hyperlipidemia, unspecified: Secondary | ICD-10-CM | POA: Diagnosis not present

## 2018-09-15 DIAGNOSIS — K219 Gastro-esophageal reflux disease without esophagitis: Secondary | ICD-10-CM | POA: Diagnosis not present

## 2018-09-15 DIAGNOSIS — I6521 Occlusion and stenosis of right carotid artery: Secondary | ICD-10-CM | POA: Diagnosis not present

## 2018-09-15 DIAGNOSIS — E119 Type 2 diabetes mellitus without complications: Secondary | ICD-10-CM | POA: Diagnosis not present

## 2018-09-15 DIAGNOSIS — F320 Major depressive disorder, single episode, mild: Secondary | ICD-10-CM | POA: Diagnosis not present

## 2018-09-15 DIAGNOSIS — F0390 Unspecified dementia without behavioral disturbance: Secondary | ICD-10-CM | POA: Diagnosis not present

## 2018-09-15 DIAGNOSIS — I10 Essential (primary) hypertension: Secondary | ICD-10-CM | POA: Diagnosis not present

## 2018-09-15 DIAGNOSIS — I255 Ischemic cardiomyopathy: Secondary | ICD-10-CM | POA: Diagnosis not present

## 2018-09-15 DIAGNOSIS — I2109 ST elevation (STEMI) myocardial infarction involving other coronary artery of anterior wall: Secondary | ICD-10-CM | POA: Diagnosis not present

## 2018-09-15 DIAGNOSIS — E785 Hyperlipidemia, unspecified: Secondary | ICD-10-CM | POA: Diagnosis not present

## 2018-09-16 ENCOUNTER — Ambulatory Visit
Admission: RE | Admit: 2018-09-16 | Discharge: 2018-09-16 | Disposition: A | Discharge status: Home / Self Care | Payer: Medicare PPO | Source: Ambulatory Visit | Attending: Internal Medicine | Admitting: Internal Medicine

## 2018-09-16 ENCOUNTER — Other Ambulatory Visit: Payer: Self-pay

## 2018-09-16 DIAGNOSIS — K219 Gastro-esophageal reflux disease without esophagitis: Secondary | ICD-10-CM | POA: Diagnosis not present

## 2018-09-16 DIAGNOSIS — Z720 Tobacco use: Secondary | ICD-10-CM

## 2018-09-16 DIAGNOSIS — I2109 ST elevation (STEMI) myocardial infarction involving other coronary artery of anterior wall: Secondary | ICD-10-CM | POA: Diagnosis not present

## 2018-09-16 DIAGNOSIS — F17210 Nicotine dependence, cigarettes, uncomplicated: Secondary | ICD-10-CM | POA: Diagnosis not present

## 2018-09-16 DIAGNOSIS — I6521 Occlusion and stenosis of right carotid artery: Secondary | ICD-10-CM | POA: Diagnosis not present

## 2018-09-16 DIAGNOSIS — E119 Type 2 diabetes mellitus without complications: Secondary | ICD-10-CM | POA: Diagnosis not present

## 2018-09-16 DIAGNOSIS — I10 Essential (primary) hypertension: Secondary | ICD-10-CM | POA: Diagnosis not present

## 2018-09-16 DIAGNOSIS — F0390 Unspecified dementia without behavioral disturbance: Secondary | ICD-10-CM | POA: Diagnosis not present

## 2018-09-16 DIAGNOSIS — F320 Major depressive disorder, single episode, mild: Secondary | ICD-10-CM | POA: Diagnosis not present

## 2018-09-16 DIAGNOSIS — E785 Hyperlipidemia, unspecified: Secondary | ICD-10-CM | POA: Diagnosis not present

## 2018-09-16 DIAGNOSIS — I255 Ischemic cardiomyopathy: Secondary | ICD-10-CM | POA: Diagnosis not present

## 2018-09-17 DIAGNOSIS — F320 Major depressive disorder, single episode, mild: Secondary | ICD-10-CM | POA: Diagnosis not present

## 2018-09-17 DIAGNOSIS — I10 Essential (primary) hypertension: Secondary | ICD-10-CM | POA: Diagnosis not present

## 2018-09-17 DIAGNOSIS — F0390 Unspecified dementia without behavioral disturbance: Secondary | ICD-10-CM | POA: Diagnosis not present

## 2018-09-17 DIAGNOSIS — E785 Hyperlipidemia, unspecified: Secondary | ICD-10-CM | POA: Diagnosis not present

## 2018-09-17 DIAGNOSIS — I2109 ST elevation (STEMI) myocardial infarction involving other coronary artery of anterior wall: Secondary | ICD-10-CM | POA: Diagnosis not present

## 2018-09-17 DIAGNOSIS — I255 Ischemic cardiomyopathy: Secondary | ICD-10-CM | POA: Diagnosis not present

## 2018-09-17 DIAGNOSIS — E119 Type 2 diabetes mellitus without complications: Secondary | ICD-10-CM | POA: Diagnosis not present

## 2018-09-17 DIAGNOSIS — K219 Gastro-esophageal reflux disease without esophagitis: Secondary | ICD-10-CM | POA: Diagnosis not present

## 2018-09-17 DIAGNOSIS — I6521 Occlusion and stenosis of right carotid artery: Secondary | ICD-10-CM | POA: Diagnosis not present

## 2018-09-18 DIAGNOSIS — E119 Type 2 diabetes mellitus without complications: Secondary | ICD-10-CM | POA: Diagnosis not present

## 2018-09-18 DIAGNOSIS — K219 Gastro-esophageal reflux disease without esophagitis: Secondary | ICD-10-CM | POA: Diagnosis not present

## 2018-09-18 DIAGNOSIS — I10 Essential (primary) hypertension: Secondary | ICD-10-CM | POA: Diagnosis not present

## 2018-09-18 DIAGNOSIS — I2109 ST elevation (STEMI) myocardial infarction involving other coronary artery of anterior wall: Secondary | ICD-10-CM | POA: Diagnosis not present

## 2018-09-18 DIAGNOSIS — I6521 Occlusion and stenosis of right carotid artery: Secondary | ICD-10-CM | POA: Diagnosis not present

## 2018-09-18 DIAGNOSIS — F320 Major depressive disorder, single episode, mild: Secondary | ICD-10-CM | POA: Diagnosis not present

## 2018-09-18 DIAGNOSIS — F0390 Unspecified dementia without behavioral disturbance: Secondary | ICD-10-CM | POA: Diagnosis not present

## 2018-09-18 DIAGNOSIS — E785 Hyperlipidemia, unspecified: Secondary | ICD-10-CM | POA: Diagnosis not present

## 2018-09-18 DIAGNOSIS — I255 Ischemic cardiomyopathy: Secondary | ICD-10-CM | POA: Diagnosis not present

## 2018-09-21 DIAGNOSIS — F0390 Unspecified dementia without behavioral disturbance: Secondary | ICD-10-CM | POA: Diagnosis not present

## 2018-09-21 DIAGNOSIS — E785 Hyperlipidemia, unspecified: Secondary | ICD-10-CM | POA: Diagnosis not present

## 2018-09-21 DIAGNOSIS — I2109 ST elevation (STEMI) myocardial infarction involving other coronary artery of anterior wall: Secondary | ICD-10-CM | POA: Diagnosis not present

## 2018-09-21 DIAGNOSIS — I10 Essential (primary) hypertension: Secondary | ICD-10-CM | POA: Diagnosis not present

## 2018-09-21 DIAGNOSIS — F320 Major depressive disorder, single episode, mild: Secondary | ICD-10-CM | POA: Diagnosis not present

## 2018-09-21 DIAGNOSIS — E119 Type 2 diabetes mellitus without complications: Secondary | ICD-10-CM | POA: Diagnosis not present

## 2018-09-21 DIAGNOSIS — I255 Ischemic cardiomyopathy: Secondary | ICD-10-CM | POA: Diagnosis not present

## 2018-09-21 DIAGNOSIS — K219 Gastro-esophageal reflux disease without esophagitis: Secondary | ICD-10-CM | POA: Diagnosis not present

## 2018-09-21 DIAGNOSIS — I6521 Occlusion and stenosis of right carotid artery: Secondary | ICD-10-CM | POA: Diagnosis not present

## 2018-09-23 DIAGNOSIS — I10 Essential (primary) hypertension: Secondary | ICD-10-CM | POA: Diagnosis not present

## 2018-09-23 DIAGNOSIS — E785 Hyperlipidemia, unspecified: Secondary | ICD-10-CM | POA: Diagnosis not present

## 2018-09-23 DIAGNOSIS — I2109 ST elevation (STEMI) myocardial infarction involving other coronary artery of anterior wall: Secondary | ICD-10-CM | POA: Diagnosis not present

## 2018-09-23 DIAGNOSIS — I255 Ischemic cardiomyopathy: Secondary | ICD-10-CM | POA: Diagnosis not present

## 2018-09-23 DIAGNOSIS — E119 Type 2 diabetes mellitus without complications: Secondary | ICD-10-CM | POA: Diagnosis not present

## 2018-09-23 DIAGNOSIS — F0390 Unspecified dementia without behavioral disturbance: Secondary | ICD-10-CM | POA: Diagnosis not present

## 2018-09-23 DIAGNOSIS — K219 Gastro-esophageal reflux disease without esophagitis: Secondary | ICD-10-CM | POA: Diagnosis not present

## 2018-09-23 DIAGNOSIS — I6521 Occlusion and stenosis of right carotid artery: Secondary | ICD-10-CM | POA: Diagnosis not present

## 2018-09-23 DIAGNOSIS — F320 Major depressive disorder, single episode, mild: Secondary | ICD-10-CM | POA: Diagnosis not present

## 2018-09-24 ENCOUNTER — Telehealth: Payer: Self-pay | Admitting: Cardiovascular Disease

## 2018-09-24 NOTE — Telephone Encounter (Signed)
Spoke with pt dtr, patient is receiving in home PT and they called the daughter concerned because prior to exercise his bp will be 110/70 and after PT it will be 90.55. she reports his heart rate is fluctuating and they felt it maybe irregular but did not check the pulse with their fingers. She reports his ability to exercise has changed greatly and he seems to be declining. He is eating very little and is no drinking much and has lost down to 133 lbs. (weight at ov was 147 lb). [place] and is not sure what to do. Explained they will need to get his fluid statis better and that will help with both the bp and heart rate. She also has a call into the medical doctor and will discuss hospice with them because he also has issues with dementia and other diagnosis that are affecting his rehab. She will ask the PT to check his pulse and if anything else comes up she will call us. He has a follow up appt 10-14-2018.

## 2018-09-24 NOTE — Telephone Encounter (Signed)
New Message  Pt c/o BP issue: STAT if pt c/o blurred vision, one-sided weakness or slurred speech  1. What are your last 5 BP readings? 110/70 sitting, drops to 90/55 after exercise  2. Are you having any other symptoms (ex. Dizziness, headache, blurred vision, passed out)? Weight loss (down to 133 lbs), heart rate going from 64 to 80 then dropping back down to 64,   3. What is your BP issue? Fluctuating blood pressure and heart rate

## 2018-09-25 DIAGNOSIS — I6521 Occlusion and stenosis of right carotid artery: Secondary | ICD-10-CM | POA: Diagnosis not present

## 2018-09-25 DIAGNOSIS — I10 Essential (primary) hypertension: Secondary | ICD-10-CM | POA: Diagnosis not present

## 2018-09-25 DIAGNOSIS — E785 Hyperlipidemia, unspecified: Secondary | ICD-10-CM | POA: Diagnosis not present

## 2018-09-25 DIAGNOSIS — I255 Ischemic cardiomyopathy: Secondary | ICD-10-CM | POA: Diagnosis not present

## 2018-09-25 DIAGNOSIS — E119 Type 2 diabetes mellitus without complications: Secondary | ICD-10-CM | POA: Diagnosis not present

## 2018-09-25 DIAGNOSIS — F320 Major depressive disorder, single episode, mild: Secondary | ICD-10-CM | POA: Diagnosis not present

## 2018-09-25 DIAGNOSIS — F0390 Unspecified dementia without behavioral disturbance: Secondary | ICD-10-CM | POA: Diagnosis not present

## 2018-09-25 DIAGNOSIS — I2109 ST elevation (STEMI) myocardial infarction involving other coronary artery of anterior wall: Secondary | ICD-10-CM | POA: Diagnosis not present

## 2018-09-25 DIAGNOSIS — K219 Gastro-esophageal reflux disease without esophagitis: Secondary | ICD-10-CM | POA: Diagnosis not present

## 2018-10-14 ENCOUNTER — Ambulatory Visit: Payer: Medicare PPO | Admitting: Cardiology

## 2018-11-05 DEATH — deceased

## 2019-01-06 ENCOUNTER — Ambulatory Visit: Payer: 59 | Admitting: Neurology

## 2020-01-02 IMAGING — CT CT CHEST LUNG CANCER SCREENING LOW DOSE
2 of 5 series · 15 of 40 positions shown, 18 images · non-contrast
Comparison: Chest CT 06/06/2017.

CLINICAL DATA: 73-year-old male current smoker with 68 pack-year
history of smoking. Lung cancer screening examination.

EXAM:
CT CHEST WITHOUT CONTRAST LOW-DOSE FOR LUNG CANCER SCREENING
TECHNIQUE: Multidetector CT imaging of the chest was performed following the
standard protocol without IV contrast.

[Series 4: lung 1.00 br44 cor · coronal · 0.66mm/px · 3 of 325 slices shown]
[im 65/325  lung]
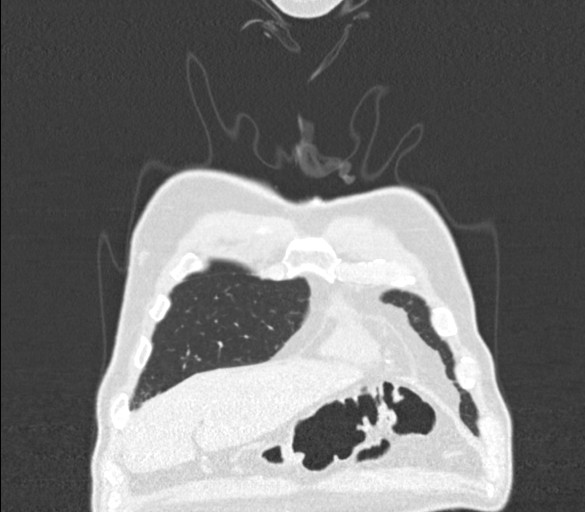
[im 130/325  lung]
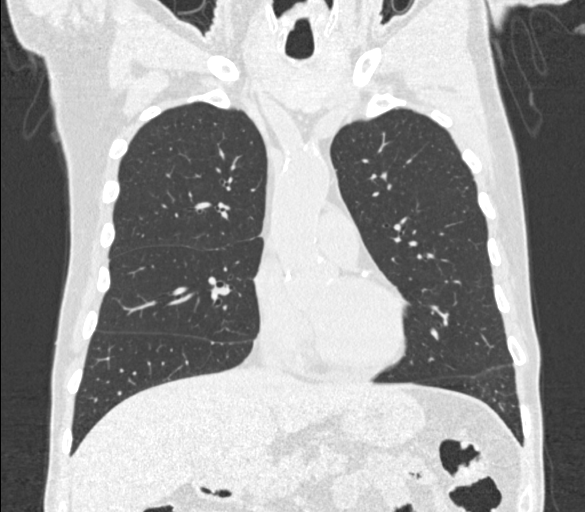
[im 195/325  lung]
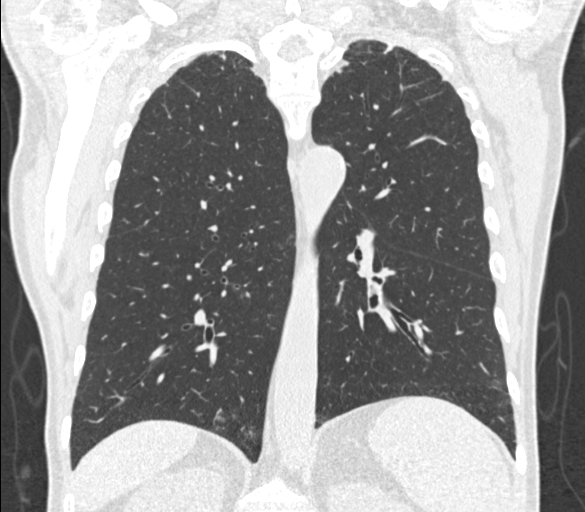

[Series 9: lung 1.00 br60 axial · axial · 0.75mm/px · z∈[-1052,-748]mm · 12 of 336 slices shown, 15 images]
[im 16/336  mediastinal]
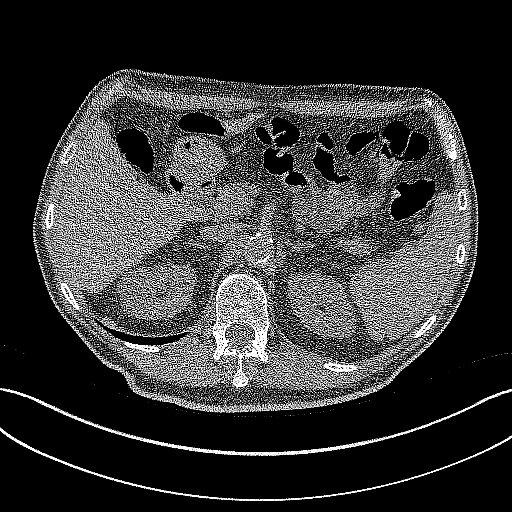
[im 16/336  lung]
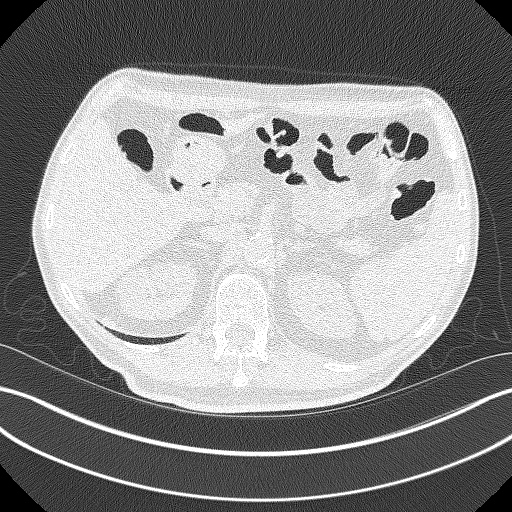
[im 46/336  lung]
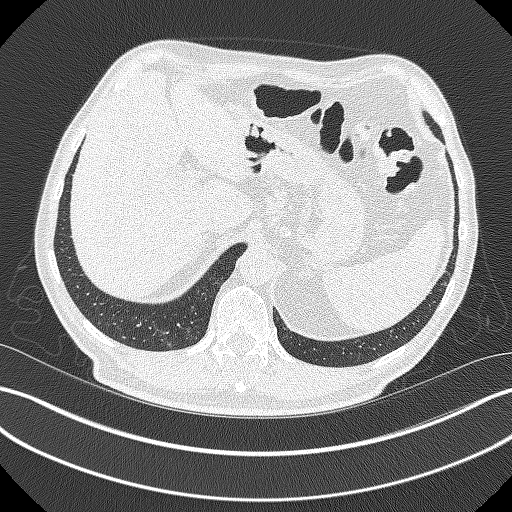
[im 77/336  lung]
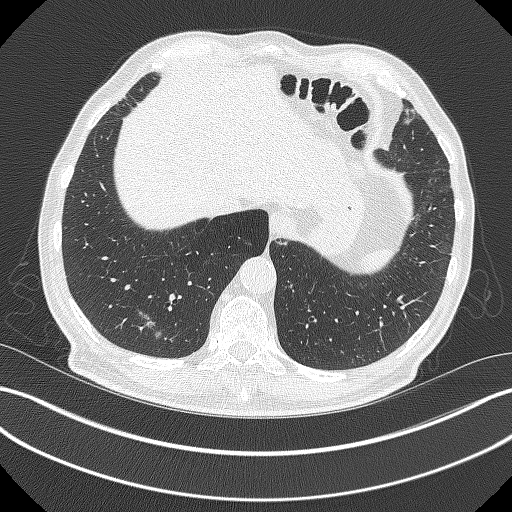
[im 107/336  lung]
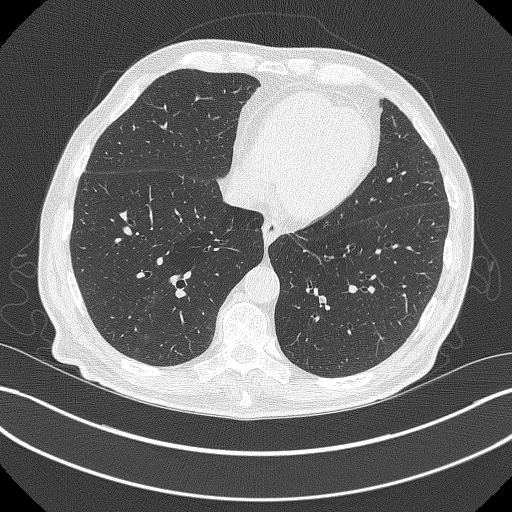
[im 122/336  mediastinal]
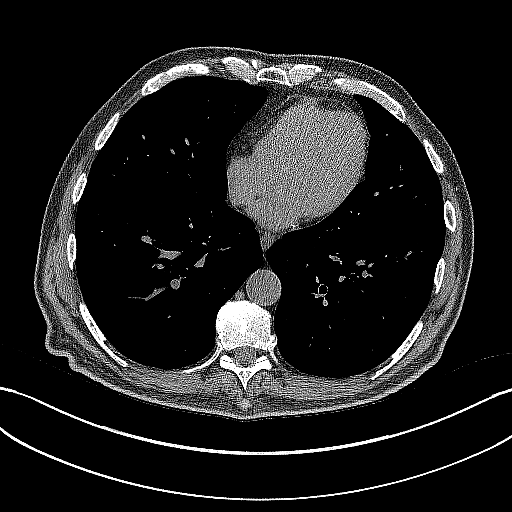
[im 122/336  lung]
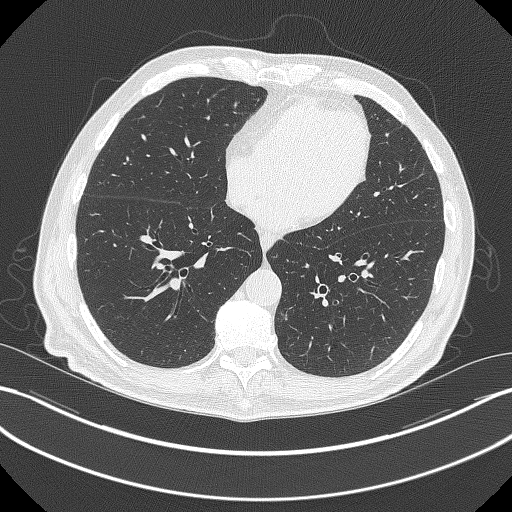
[im 153/336  lung]
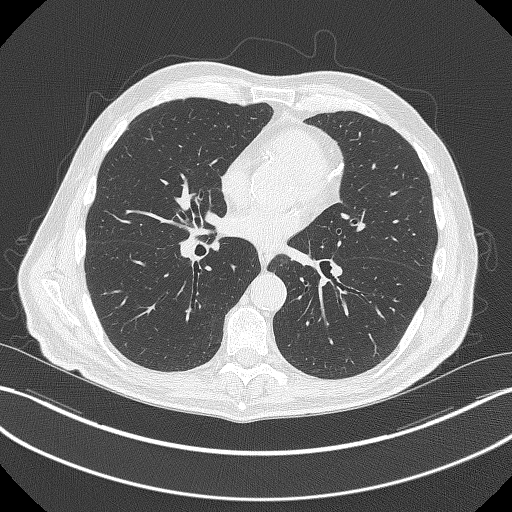
[im 183/336  lung]
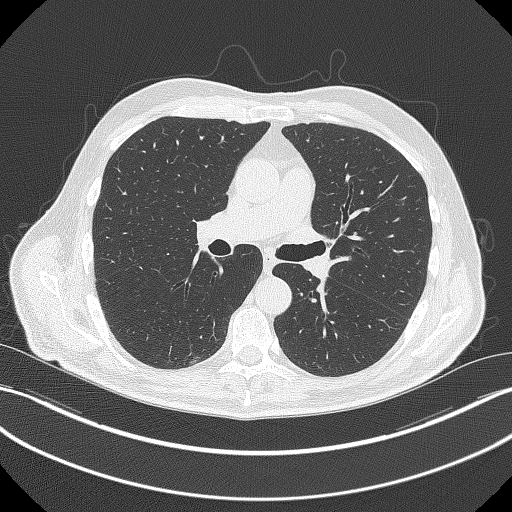
[im 214/336  lung]
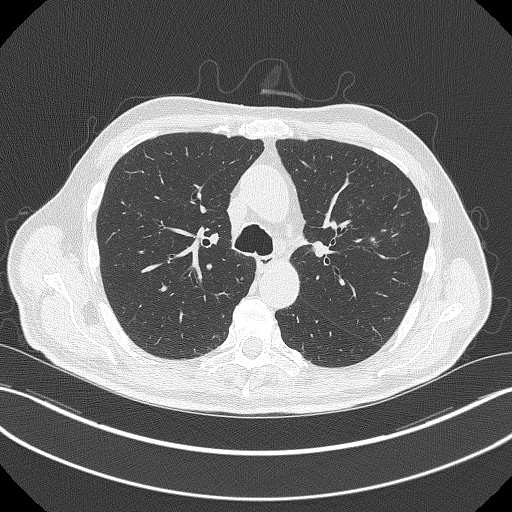
[im 229/336  mediastinal]
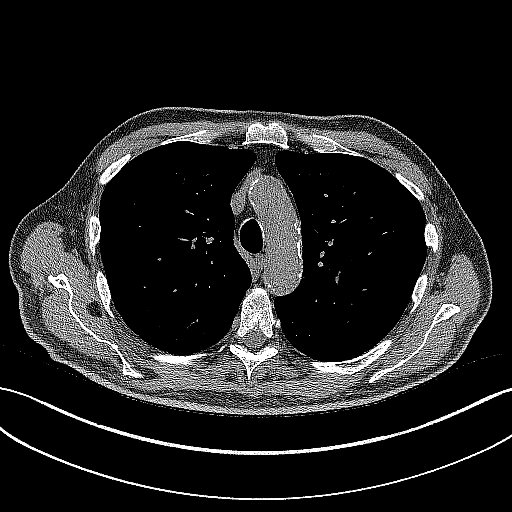
[im 229/336  lung]
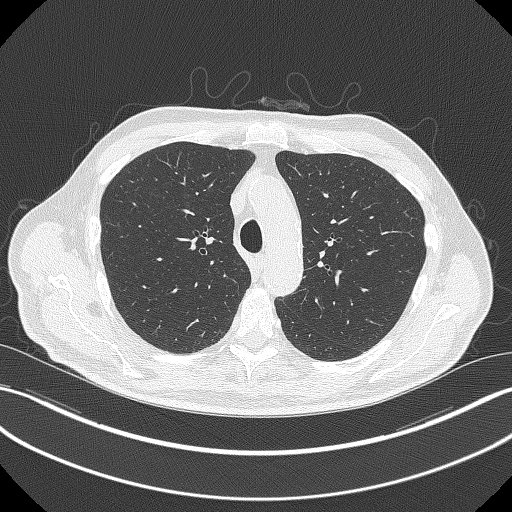
[im 259/336  lung]
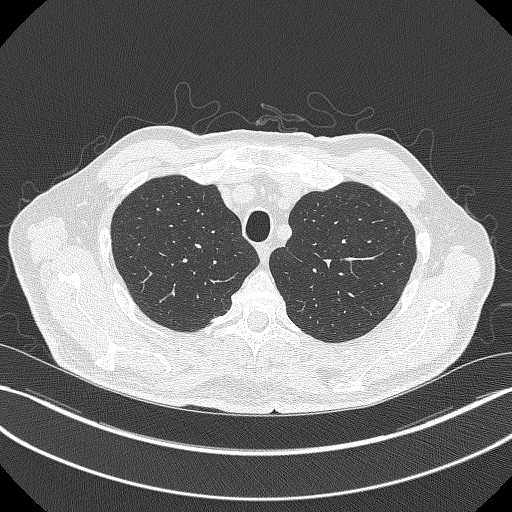
[im 290/336  lung]
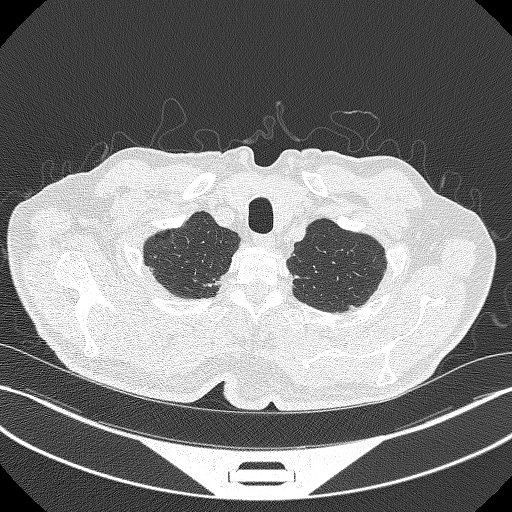
[im 320/336  lung]
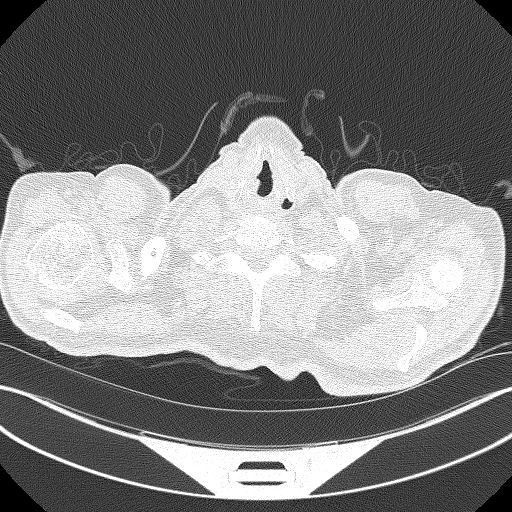

[15 of 40 positions shown; findings below may reference images not displayed]

FINDINGS: Cardiovascular: Heart size is normal. There is no significant
pericardial fluid, thickening or pericardial calcification. There is
aortic atherosclerosis, as well as atherosclerosis of the great
vessels of the mediastinum and the coronary arteries, including
calcified atherosclerotic plaque in the left main, left anterior
descending, left circumflex and right coronary arteries.

Mediastinum/Nodes: No pathologically enlarged mediastinal or hilar
lymph nodes. Please note that accurate exclusion of hilar adenopathy
is limited on noncontrast CT scans. Esophagus is unremarkable in
appearance. No axillary lymphadenopathy.

Lungs/Pleura: Multiple new solid and sub solid nodules are noted
throughout the lungs bilaterally. Largest and most concerning of
these is a solid appearing nodule in the left lower lobe (axial
image 202 of series 3), with a volume derived mean diameter of
mm. No acute consolidative airspace disease. No pleural effusions.
Mild diffuse bronchial wall thickening with very mild centrilobular
and paraseptal emphysema.

Upper Abdomen: Aortic atherosclerosis.  Status post cholecystectomy.

Musculoskeletal: There are no aggressive appearing lytic or blastic
lesions noted in the visualized portions of the skeleton.
IMPRESSION: 1. Lung-RADS 3S, probably benign findings. Short-term follow-up in 6
months is recommended with repeat low-dose chest CT without contrast
(please use the following order, "CT CHEST LCS NODULE FOLLOW-UP W/O
CM").
2. The "S" modifier above refers to potentially clinically
significant non lung cancer related findings. Specifically, there is
aortic atherosclerosis, in addition to left main and 3 vessel
coronary artery disease. Please note that although the presence of
coronary artery calcium documents the presence of coronary artery
disease, the severity of this disease and any potential stenosis
cannot be assessed on this non-gated CT examination. Assessment for
potential risk factor modification, dietary therapy or pharmacologic
therapy may be warranted, if clinically indicated.
3. Mild diffuse bronchial wall thickening with very mild
centrilobular and paraseptal emphysema; imaging findings suggestive
of underlying COPD.

Aortic Atherosclerosis (OLK7X-TUY.Y) and Emphysema (OLK7X-FO5.P).
